# Patient Record
Sex: Female | Born: 1975 | Race: White | Hispanic: No | Marital: Married | Smoking: Former smoker
Health system: Southern US, Community
[De-identification: ages and names within clinical notes are randomized; demographics above are authoritative.]

## PROBLEM LIST (undated history)

## (undated) DIAGNOSIS — N926 Irregular menstruation, unspecified: Secondary | ICD-10-CM

## (undated) DIAGNOSIS — E669 Obesity, unspecified: Secondary | ICD-10-CM

## (undated) DIAGNOSIS — F419 Anxiety disorder, unspecified: Secondary | ICD-10-CM

## (undated) HISTORY — DX: Obesity, unspecified: E66.9

## (undated) HISTORY — PX: APPENDECTOMY: SHX54

## (undated) HISTORY — DX: Irregular menstruation, unspecified: N92.6

## (undated) HISTORY — DX: Anxiety disorder, unspecified: F41.9

---
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
## 1998-07-09 ENCOUNTER — Encounter: Payer: Self-pay | Admitting: Family Medicine

## 2000-02-04 HISTORY — PX: CRYOTHERAPY: SHX1416

## 2005-02-03 HISTORY — PX: CHOLECYSTECTOMY: SHX55

## 2007-07-17 ENCOUNTER — Emergency Department (HOSPITAL_COMMUNITY): Admission: EM | Admit: 2007-07-17 | Discharge: 2007-07-17 | Payer: Self-pay | Admitting: Emergency Medicine

## 2010-01-24 ENCOUNTER — Ambulatory Visit: Payer: Self-pay | Admitting: Family Medicine

## 2010-01-24 DIAGNOSIS — B343 Parvovirus infection, unspecified: Secondary | ICD-10-CM | POA: Insufficient documentation

## 2010-01-24 DIAGNOSIS — R21 Rash and other nonspecific skin eruption: Secondary | ICD-10-CM

## 2010-03-06 ENCOUNTER — Encounter: Payer: Self-pay | Admitting: Family Medicine

## 2010-03-06 DIAGNOSIS — F988 Other specified behavioral and emotional disorders with onset usually occurring in childhood and adolescence: Secondary | ICD-10-CM | POA: Insufficient documentation

## 2010-03-06 DIAGNOSIS — R002 Palpitations: Secondary | ICD-10-CM | POA: Insufficient documentation

## 2010-03-06 DIAGNOSIS — F410 Panic disorder [episodic paroxysmal anxiety] without agoraphobia: Secondary | ICD-10-CM | POA: Insufficient documentation

## 2010-03-07 NOTE — Assessment & Plan Note (Signed)
Summary: NOV PARVOVIRUS   Vital Signs:  Patient profile:   35 year old female Height:      66 inches Weight:      176 pounds Temp:     98.4 degrees F Pulse rate:   86 / minute BP sitting:   134 / 86  (left arm) Cuff size:   regular  Vitals Entered By: Kathlene November LPN (January 24, 2010 12:53 PM) CC: feet swelling, feels knots on feet, sever pain- can not hardly walk for 3-4 days. Has been taking Naproxen and not helping Pain Assessment Patient in pain? yes     Location: foot Intensity: 10 Type: sharp Onset of pain  3-4 days ago   Primary Care Provider:  Nani Gasser MD  CC:  feet swelling, feels knots on feet, and sever pain- can not hardly walk for 3-4 days. Has been taking Naproxen and not helping.  History of Present Illness: 35 yo WF presents for bilat foot pain x 4 days.  She had a rash last week and her son had a viral infection with his rash.  She had subjective fevers and joint aches.  Denies any GI upset or sore throat or congestion.  Her joint pains are improving but her hands feel swollen.  Her knees ache some.  Her lower back is achey.    She is taking Aleve but it is not helping.  Denies any trauma.    Current Medications (verified): 1)  None  Allergies (verified): No Known Drug Allergies  Comments:  Nurse/Medical Assistant: The patient's medications and allergies were reviewed with the patient and were updated in the Medication and Allergy Lists. Kathlene November LPN (January 24, 2010 12:54 PM)  Past History:  Past Medical History: none  Past Surgical History: knee surgery 1997 lap chole 07  Family History: mother HTN father DM  Social History: SAHM. finished college. Married to [place].  Has 2 sons. Quit smoking in 06. Denies exercsie.  Review of Systems      See HPI       no fevers/sweats/weakness, unexplained wt loss/gain, no change in vision, no difficulty hearing, ringing in ears, no hay fever/allergies, no CP/discomfort, no  palpitations, no breast lump/nipple discharge, no cough/wheeze, no blood in stool, no N/V/D, no nocturia, no leaking urine, no unusual vag bleeding, no vaginal/penile discharge, + muscle/joint pain, + rash, no new/changing mole, no HA, no memory loss, no anxiety, no sleep problem, no depression, no unexplained lumps, no easy bruising/bleeding, no concern with sexual function   Physical Exam  General:  alert, well-developed, well-nourished, and well-hydrated.   Head:  normocephalic and atraumatic.   Eyes:  conjunctiva clear Ears:  EACs patent; TMs translucent and gray with good cone of light and bony landmarks.  Nose:  no nasal congestion Mouth:  o/p injected with R>L tonsilar hypertrophy, small R tonsilar exduate Neck:  shotty anterior cervica chain LA Lungs:  Normal respiratory effort, chest expands symmetrically. Lungs are clear to auscultation, no crackles or wheezes. Heart:  normal rate, regular rhythm, and no murmur.   Abdomen:  soft, non-tender, normal bowel sounds, no distention, no hepatomegaly, and no splenomegaly.   Msk:  tender joints w/o effusions Extremities:  no LE edema Skin:  facial flushing with faint lacey rash on her trunk and extremities.   Psych:  good eye contact, not anxious appearing, and not depressed appearing.     Impression & Recommendations:  Problem # 1:  PARVOVIRUS B19 (ICD-079.83) Rapid strep neg.  History and  physical are c/w Parvovirus B19. H/O given to pt on this virus and can certainly explain her low grade fever, lacey rash, erythematous cheeks and constitutional symptoms followed by arthropathy after her sons similar illness.  Will treat her with Motrin 800 mg three times a day with food + supportive care. Hopefully her arthropathy resolves in the next days/ wks but it can last longer. She is to call if any bruising or bleeding to check her PLT count.  Avoid exposure to pregnant women and call if any problems.  Complete Medication List: 1)   Ibuprofen 800 Mg Tabs (Ibuprofen) .Marland Kitchen.. 1 tab by mouth three times a day with food as needed for joint pains  Other Orders: Rapid Strep (19147)  Patient Instructions: 1)  Treat for Parvovirus with rest, clear fluids, Tylenol alternating with RX Ibuprofen 3 x a day with food. 2)  Joint aches and pains should improve in the next week. 3)  If you develop any bleeding or bruising, please call us. Prescriptions: IBUPROFEN 800 MG TABS (IBUPROFEN) 1 tab by mouth three times a day with food as needed for joint pains  #40 x 0   Entered and Authorized by:   Seymour Bars DO   Signed by:   Seymour Bars DO on 01/24/2010   Method used:   Electronically to        [facility]  [address] (retail)       [address]       [place], [place]  [address]       Ph: [phone]       Fax: [phone]   RxID:   [id number]    Orders Added: 1)  Rapid Strep [03474] 2)  New Patient Level III [99203]    Laboratory Results    Other Tests  Rapid Strep: negative

## 2010-03-13 NOTE — Miscellaneous (Signed)
Summary: old records  Clinical Lists Changes  Problems: Added new problem of PALPITATIONS (ICD-785.1) Added new problem of ADD (ICD-314.00) Added new problem of PANIC ATTACK (ICD-300.01) Assessed PANIC ATTACK as comment only - Was on Celexa 2009. Observations: Added new observation of PRIMARY MD: Seymour Bars DO (03/06/2010 10:20)        Impression & Recommendations:  Problem # 1:  PANIC ATTACK (ICD-300.01) Was on Celexa 2009.  Complete Medication List: 1)  Ibuprofen 800 Mg Tabs (Ibuprofen) .Marland Kitchen.. 1 tab by mouth three times a day with food as needed for joint pains

## 2010-03-21 NOTE — Letter (Signed)
Summary: Records from Dr. Katrinka Blazing 1996 - 2000  Records from Dr. Katrinka Blazing 1996 - 2000   Imported By: Maryln Gottron 03/13/2010 13:21:09  _____________________________________________________________________  External Attachment:    Type:   Image     Comment:   External Document

## 2010-04-16 ENCOUNTER — Encounter: Payer: Self-pay | Admitting: Family Medicine

## 2010-04-16 ENCOUNTER — Ambulatory Visit (INDEPENDENT_AMBULATORY_CARE_PROVIDER_SITE_OTHER): Payer: Self-pay

## 2010-04-16 DIAGNOSIS — Z111 Encounter for screening for respiratory tuberculosis: Secondary | ICD-10-CM

## 2010-04-16 DIAGNOSIS — Z23 Encounter for immunization: Secondary | ICD-10-CM

## 2010-04-23 NOTE — Assessment & Plan Note (Signed)
Summary: TB and Tetnus booster   Nurse Visit   Allergies: No Known Drug Allergies  Immunizations Administered:  Tetanus Vaccine:    Vaccine Type: Tdap    Site: right deltoid    Mfr: GlaxoSmithKline    Dose: 0.5 ml    Route: IM    Given by: Sue Lush McCrimmon CMA, (AAMA)    Exp. Date: 11/23/2010    Lot #: [id number]    VIS given: 12/22/07 version given April 16, 2010.  PPD Skin Test:    Vaccine Type: PPD    Site: right forearm    Mfr: Sanofi Pasteur    Dose: 0.1 ml    Given by: Sue Lush McCrimmon CMA, (AAMA)    Exp. Date: 10/05/2011    Lot #: [id number]  Orders Added: 1)  Tdap => 53yrs IM [90715] 2)  Admin 1st Vaccine [90471] 3)  TB Skin Test [86580] 4)  Admin of Any Addtl Vaccine [62130]

## 2010-04-30 ENCOUNTER — Ambulatory Visit: Payer: Self-pay

## 2010-10-31 LAB — URINALYSIS, ROUTINE W REFLEX MICROSCOPIC
Glucose, UA: NEGATIVE
Ketones, ur: NEGATIVE
Protein, ur: NEGATIVE

## 2010-10-31 LAB — DIFFERENTIAL
Basophils Relative: 1
Eosinophils Absolute: 0.2
Monocytes Absolute: 0.4
Monocytes Relative: 7
Neutrophils Relative %: 75

## 2010-10-31 LAB — BASIC METABOLIC PANEL
CO2: 25
Chloride: 113 — ABNORMAL HIGH
Creatinine, Ser: 0.64
GFR calc Af Amer: >60
Glucose, Bld: 108 — ABNORMAL HIGH

## 2010-10-31 LAB — CBC
MCHC: 35
MCV: 91.5
RBC: 4.03

## 2010-10-31 LAB — URINE MICROSCOPIC-ADD ON

## 2011-02-13 ENCOUNTER — Telehealth: Payer: Self-pay | Admitting: Family Medicine

## 2011-02-13 ENCOUNTER — Ambulatory Visit (INDEPENDENT_AMBULATORY_CARE_PROVIDER_SITE_OTHER): Payer: 59 | Admitting: Family Medicine

## 2011-02-13 ENCOUNTER — Encounter: Payer: Self-pay | Admitting: *Deleted

## 2011-02-13 ENCOUNTER — Ambulatory Visit
Admission: RE | Admit: 2011-02-13 | Discharge: 2011-02-13 | Disposition: A | Discharge status: Home / Self Care | Payer: 59 | Source: Ambulatory Visit | Attending: Family Medicine | Admitting: Family Medicine

## 2011-02-13 ENCOUNTER — Encounter (HOSPITAL_COMMUNITY): Payer: Self-pay | Admitting: Emergency Medicine

## 2011-02-13 ENCOUNTER — Emergency Department (HOSPITAL_COMMUNITY)
Admission: EM | Admit: 2011-02-13 | Discharge: 2011-02-13 | Disposition: A | Discharge status: Home / Self Care | Payer: 59 | Attending: Emergency Medicine | Admitting: Emergency Medicine

## 2011-02-13 VITALS — BP 148/83 | HR 97 | Temp 97.5°F | Ht 66.0 in | Wt 178.0 lb

## 2011-02-13 DIAGNOSIS — R1031 Right lower quadrant pain: Secondary | ICD-10-CM

## 2011-02-13 DIAGNOSIS — R109 Unspecified abdominal pain: Secondary | ICD-10-CM | POA: Insufficient documentation

## 2011-02-13 DIAGNOSIS — Z9089 Acquired absence of other organs: Secondary | ICD-10-CM | POA: Insufficient documentation

## 2011-02-13 LAB — CBC
HCT: 39.3 % (ref 36.0–46.0)
MCH: 31.1 pg (ref 26.0–34.0)
MCV: 91.2 fL (ref 78.0–100.0)
Platelets: 230 10*3/uL (ref 150–400)
RDW: 11.7 % (ref 11.5–15.5)
WBC: 7.5 10*3/uL (ref 4.0–10.5)

## 2011-02-13 LAB — URINALYSIS, ROUTINE W REFLEX MICROSCOPIC
Bilirubin Urine: NEGATIVE
Glucose, UA: NEGATIVE mg/dL
Ketones, ur: NEGATIVE mg/dL
Protein, ur: NEGATIVE mg/dL
pH: 7.5 (ref 5.0–8.0)

## 2011-02-13 LAB — DIFFERENTIAL
Basophils Absolute: 0 10*3/uL (ref 0.0–0.1)
Eosinophils Absolute: 0.3 10*3/uL (ref 0.0–0.7)
Eosinophils Relative: 4 % (ref 0–5)
Lymphocytes Relative: 30 % (ref 12–46)
Lymphs Abs: 2.3 10*3/uL (ref 0.7–4.0)
Monocytes Absolute: 0.6 10*3/uL (ref 0.1–1.0)

## 2011-02-13 MED ORDER — IOHEXOL 300 MG/ML  SOLN
100.0000 mL | Freq: Once | INTRAMUSCULAR | Status: AC | PRN
  Administered 2011-02-13: 100 mL via INTRAVENOUS

## 2011-02-13 NOTE — ED Provider Notes (Addendum)
History     CSN: [id number]  Arrival date & time 02/13/11  1642   First MD Initiated Contact with Patient 02/13/11 1947      Chief Complaint  Patient presents with  . Abdominal Pain    (Consider location/radiation/quality/duration/timing/severity/associated sxs/prior treatment) HPI Comments: Sent here from pcp's office for eval of rlq pain she has been having for the past two weeks.  The ct done in the office shows a dilated appendix, unsure as to whether normal variant or early acute appendicitis.    Patient is a 36 y.o. female presenting with abdominal pain.  Abdominal Pain The primary symptoms of the illness include abdominal pain.    No past medical history on file.  Past Surgical History  Procedure Date  . Knee surgery 1997  . Cholecystectomy 2007    Family History  Problem Relation Age of Onset  . Hypertension Mother   . Diabetes Father   . Hypertension Father     History  Substance Use Topics  . Smoking status: Former Smoker    Quit date: 02/04/2004  . Smokeless tobacco: Not on file  . Alcohol Use: Yes    OB History    Grav Para Term Preterm Abortions TAB SAB Ect Mult Living                  Review of Systems  Gastrointestinal: Positive for abdominal pain.    Allergies  Review of patient's allergies indicates no known allergies.  Home Medications  No current outpatient prescriptions on file.  BP 149/75  Pulse 93  Temp(Src) 98.8 F (37.1 C) (Oral)  Resp 12  Ht 5\' 6"  (1.676 m)  Wt 170 lb (77.111 kg)  BMI 27.44 kg/m2  SpO2 100%  LMP 01/31/2011  Physical Exam  Nursing note and vitals reviewed. Constitutional: She is oriented to person, place, and time. She appears well-developed and well-nourished. No distress.  HENT:  Head: Normocephalic and atraumatic.  Neck: Normal range of motion. Neck supple.  Cardiovascular: Normal rate and regular rhythm.   Pulmonary/Chest: Effort normal and breath sounds normal.  Abdominal: Soft.       There  is ttp in the rlq but no rebound or guarding.  Bowel sounds are normoactive.  Musculoskeletal: Normal range of motion.  Neurological: She is alert and oriented to person, place, and time.  Skin: Skin is warm and dry. She is not diaphoretic.    ED Course  Procedures (including critical care time)  Labs Reviewed - No data to display Ct Abdomen Pelvis W Contrast  02/13/2011  *RADIOLOGY REPORT*  Clinical Data: Right lower quadrant pain.  Evaluate for appendicitis.  Pain since December 20.  Cholecystectomy in 2012.  CT ABDOMEN AND PELVIS WITH CONTRAST  Technique:  Multidetector CT imaging of the abdomen and pelvis was performed following the standard protocol during bolus administration of intravenous contrast.  Contrast: OMNIPAQUE IOHEXOL 300 MG/ML IV SOLN  Comparison: None.  Findings: Clear lung bases.  Normal heart size without pericardial or pleural effusion.  Normal liver, spleen, stomach, pancreas. Cholecystectomy without biliary ductal dilatation.  Normal adrenal glands and kidneys.  Prominent gonadal veins bilaterally. No retroperitoneal or retrocrural adenopathy.  The sigmoid colon is underdistended.  Portions of the descending and hepatic flexure colon appear mildly thick-walled, including on images 24 and 30.  These could also be secondary to underdistension.  The terminal ileum is normal on image 52.  The appendix is identified on transverse image 61 coronal image 28. Measures up  to the 10 - 11 mm, including on image 63 transverse, image 28 coronal.  There is no mucosal hyperenhancement.  No definite surrounding edema.  No periappendiceal abscess.  Normal small bowel without abdominal ascites.    No pelvic adenopathy.  Normal urinary bladder and uterus, without adnexal mass or significant free pelvic fluid.  No acute osseous abnormality.  IMPRESSION:  1.  Abnormal appearance of the appendix.  Measures up to 10 - 11 mm (upper limits of normal 8 - 9 mm).  However, there is no evidence of  surrounding inflammation or mucosal hyperenhancement.  The clinical data describes right lower quadrant pain since December 20.  If the patient's symptoms are acutely progressive, early appendicitis is suspected.  If the patient's symptoms are chronic and indolent, appendicitis is less likely and this could be within normal variation. 2.  Equivocal left-sided colonic wall thickening, possibly due to underdistension.  Correlate with symptoms to suggest infectious colitis. 3.  Prominent gonadal veins, as can be seen with pelvic congestion syndrome.  These results will be called to the ordering clinician or representative by the Radiologist Assistant, and communication documented in the PACS Dashboard.  Original Report Authenticated By: Consuello Bossier, M.D.     No diagnosis found.    MDM  The patient presents with a two week history of rlq pain, normal wbc count, and ct scan that suggests but not diagnostic of appy.  Will consult general surgery for evaluation.   Seen by general surgery, they do not believe this to be an emergently surgical situation.  Will discharge to home and follow up with them.        Geoffery Lyons, MD 02/13/11 9811  Geoffery Lyons, MD 02/13/11 9147  Geoffery Lyons, MD 03/26/11 1209

## 2011-02-13 NOTE — Consult Note (Signed)
Chief Complaint  Patient presents with  . Abdominal Pain   Asked to see patient by Dr. Eppie Gibson (primary care) and Dr. Judd Lien (ER at [facility])  HISTORY: Patient is a 36 yo white female with a history of abdominal pain.  Pain has been present intermittently for approx 3 weeks.  Localizes to RLQ.  May last all day.  May be gone for 3-4 days.  No nausea. No fever. No chills.  Normal appetite and normal BM's.  No urinary sx's.  No vaginal discharge.  No hx of endometriosis.  Patient seen by primary MD and referred for CT Abd.  Only finding was a slight thickened appendix at 10-11 mm.  No inflammatory findings.  Referred to [facility] ER for surgical evaluation.  Lab work with normal WBC of 7.5.  No past medical history on file.   No current facility-administered medications for this encounter.   Current Outpatient Prescriptions  Medication Sig Dispense Refill  . ibuprofen (ADVIL,MOTRIN) 600 MG tablet Take 600 mg by mouth every 6 (six) hours as needed. PAIN       Facility-Administered Medications Ordered in Other Encounters  Medication Dose Route Frequency Provider Last Rate Last Dose  . iohexol (OMNIPAQUE) 300 MG/ML solution 100 mL  100 mL Intravenous Once PRN Medication Radiologist   100 mL at 02/13/11 1318     No Known Allergies   Family History  Problem Relation Age of Onset  . Hypertension Mother   . Diabetes Father   . Hypertension Father      History   Social History  . Marital Status: Married    Spouse Name: N/A    Number of Children: N/A  . Years of Education: N/A   Social History Main Topics  . Smoking status: Former Smoker    Quit date: 02/04/2004  . Smokeless tobacco: None  . Alcohol Use: Yes  . Drug Use: No  . Sexually Active: Yes    Birth Control/ Protection: None   Other Topics Concern  . None   Social History Narrative  . None     REVIEW OF SYSTEMS - PERTINENT POSITIVES ONLY: As above.  No fever, no chills, no nausea, no emesis.  At present, no pain.   Hungry.   EXAM: Filed Vitals:   02/13/11 1723  BP: 149/75  Pulse: 93  Temp: 98.8 F (37.1 C)  Resp: 12    HEENT: normocephalic; pupils equal and reactive; sclerae clear; dentition good; mucous membranes moist NECK:  No nodules; symmetric on extension; no palpable anterior or posterior cervical lymphadenopathy; no supraclavicular masses; no tenderness CHEST: clear to auscultation bilaterally without rales, rhonchi, or wheezes CARDIAC: regular rate and rhythm without significant murmur; peripheral pulses are full ABDOMEN: Soft without distension; normal BS; no mass; no hernia; healed wound at umbilicus; minimal tender mid RLQ; no guarding; no rebound EXT:  non-tender without edema; no deformity NEURO: no gross focal deficits; no sign of tremor   LABORATORY RESULTS: See E-Chart for most recent results   RADIOLOGY RESULTS: See E-Chart or I-Site for most recent results   IMPRESSION: Abdominal pain, intermittent, of undetermined etiology.  No evidence of acute abdomen.  PLAN: Patient does not desire admission or laparoscopy at this time.  Will discharge from ER with mother.  Patient to follow up by telephone tomorrow with my office.  She will see me in the office in 3 weeks.  If persistent symptoms, will consider laparoscopy with appendectomy.  If acute symptoms develop in the interim, she will contact  me.  Follow up with surgeon in 3 weeks.  Velora Heckler, MD, FACS General & Endocrine Surgery [facility]   Visit Diagnoses: No diagnosis found.  Primary Care Physician: Seymour Bars, DO, DO

## 2011-02-13 NOTE — Progress Notes (Signed)
  Subjective:    Patient ID: Tammy Rivera, female    DOB: 05-25-1975, 36 y.o.   MRN: [id number]  HPI Start with generalized cramping and then progressed to RLQ pain.  Says it is intermittant. Has been exercisinging.  No triggers that make it worse.  No real alleviating sxs.  Says now is a dull ache and radiates into her right low back too.  No fever now.  Iniitally when started before x mas with the cramping vomited 2 x but not diarrhea.  Worse the last 2 days and more constant. Sometimes will wake up with it.  Normla period but did have some mild cramping after her period and that was unusual. Initially felt better to be in a "ball" but now can stand up and it is not worse.    Review of Systems BP 148/83  Pulse 97  Temp(Src) 97.5 F (36.4 C) (Oral)  Ht 5\' 6"  (1.676 m)  Wt 178 lb (80.74 kg)  BMI 28.73 kg/m2  SpO2 100%    Allergies not on file  No past medical history on file.  No past surgical history on file.  History   Social History  . Marital Status: Married    Spouse Name: N/A    Number of Children: N/A  . Years of Education: N/A   Occupational History  . Not on file.   Social History Main Topics  . Smoking status: Not on file  . Smokeless tobacco: Not on file  . Alcohol Use: Not on file  . Drug Use: Not on file  . Sexually Active: Not on file   Other Topics Concern  . Not on file   Social History Narrative  . No narrative on file    No family history on file.       Objective:   Physical Exam  Constitutional: She is oriented to person, place, and time. She appears well-developed and well-nourished.  Cardiovascular: Normal rate, regular rhythm and normal heart sounds.   Pulmonary/Chest: Effort normal and breath sounds normal.  Abdominal: Soft. Bowel sounds are normal. She exhibits no distension and no mass. There is tenderness. There is no rebound and no guarding.       Tender over the right mid abdomen and RLQ.  Some guarding over the right  mid-abdomen.   Neurological: She is alert and oriented to person, place, and time.  Skin: Skin is warm and dry.  Psychiatric: She has a normal mood and affect. Her behavior is normal.          Assessment & Plan:  RLQ pain - Consider either apppendicitis vs ovarian cyst. I really don't think this is colitis. Will schedule for CT today. Insurance contacted for prior auth. She is not in need of pain medications at this time.  If normal then consider check CBC and CMP.  Will call as soon as get results. Go to ED if pain suddenly gets worse or feels weak or dizzy.

## 2011-02-13 NOTE — Telephone Encounter (Signed)
Spoke with the surgeon on call at [facility] ( Dr. Michaell Cowing). He looked at the Ct and we discussed case.  Pt to go to the ED and have on call surg paged ot examine her and see if may be her appendix or not.

## 2011-02-13 NOTE — ED Notes (Signed)
Pt with abdominal pain and illness on and off since week before Christmas. Went to [facility] and had CT of Abdomen done and was advised to go to "ED to see Dr. Gerrit Friends." Pt denies n/v and reports only "a little" pain in abdomen.

## 2011-02-14 ENCOUNTER — Telehealth (INDEPENDENT_AMBULATORY_CARE_PROVIDER_SITE_OTHER): Payer: Self-pay

## 2011-02-14 NOTE — Telephone Encounter (Signed)
Patient called given hospital follow up appointment - She stated she is doing ok at the moment- Patient advised to call if the condition should worsen.

## 2011-03-10 ENCOUNTER — Encounter (INDEPENDENT_AMBULATORY_CARE_PROVIDER_SITE_OTHER): Payer: Self-pay | Admitting: Surgery

## 2011-03-14 ENCOUNTER — Ambulatory Visit: Payer: 59 | Admitting: Physician Assistant

## 2011-03-14 ENCOUNTER — Ambulatory Visit (INDEPENDENT_AMBULATORY_CARE_PROVIDER_SITE_OTHER): Payer: 59 | Admitting: Physician Assistant

## 2011-03-14 ENCOUNTER — Encounter: Payer: Self-pay | Admitting: Physician Assistant

## 2011-03-14 VITALS — BP 137/85 | HR 84 | Temp 98.4°F | Ht 66.0 in | Wt 177.0 lb

## 2011-03-14 DIAGNOSIS — J029 Acute pharyngitis, unspecified: Secondary | ICD-10-CM

## 2011-03-14 MED ORDER — AMOXICILLIN-POT CLAVULANATE 875-125 MG PO TABS: 1.0000 | ORAL_TABLET | Freq: Two times a day (BID) | ORAL | Status: DC

## 2011-03-14 NOTE — Progress Notes (Signed)
  Subjective:    Patient ID: Tammy Rivera, female    DOB: [birth year], 36 y.o.   MRN: [id number]  HPI Patient brought son in on Monday with strep throat. She started having a sore throat on Monday. It has gradually worsened for 5 days. She denies any SOB, cough, runny nose, fever, chills,ear pain or headaches. She has not been able to eat anything. She has tried lozengers with help minimally and she is drinking a lot. She takes advil for the pain but it does not help. Patient reports her throat hurts even when she talkes and rates it a 6/10.   Patient has not had a Pap smear in a number of years. She has had abnormal pap smears in the past.  Review of Systems     Objective:   Physical Exam  Constitutional: She is oriented to person, place, and time. She appears well-developed and well-nourished.  HENT:  Head: Normocephalic and atraumatic.  Right Ear: External ear normal.  Left Ear: External ear normal.  Nose: Nose normal.  Mouth/Throat: No oropharyngeal exudate.       Oropharynx erythematous without exudate.   Eyes: Conjunctivae are normal.  Neck:       Bilateral anterior cervical adenopathy with tenderness to palpation.   Cardiovascular: Normal rate, regular rhythm and normal heart sounds.   Pulmonary/Chest: Effort normal and breath sounds normal.  Lymphadenopathy:    She has cervical adenopathy.  Neurological: She is alert and oriented to person, place, and time.  Psychiatric: She has a normal mood and affect. Her behavior is normal.          Assessment & Plan:  Sore throat- Rapid Strep (neg) Due to patient being exposed to strep and no other upper respiratory symptoms. I treated empirically with Augmentin. Handout given on home treatment of sore throat. Call if not improving by Monday.   Discussed need to schedule PaP smear.

## 2011-03-14 NOTE — Patient Instructions (Signed)
Start antibiotic. If not improving call office on Monday.   Strep Throat Strep throat is an infection of the throat caused by a bacteria named Streptococcus pyogenes. Your caregiver may call the infection streptococcal "tonsillitis" or "pharyngitis" depending on whether there are signs of inflammation in the tonsils or back of the throat. Strep throat is most common in children from 21 to 34 years old during the cold months of the year, but it can occur in people of any age during any season. This infection is spread from person to person (contagious) through coughing, sneezing, or other close contact. SYMPTOMS   Fever or chills.   Painful, swollen, red tonsils or throat.   Pain or difficulty when swallowing.   White or yellow spots on the tonsils or throat.   Swollen, tender lymph nodes or "glands" of the neck or under the jaw.   Red rash all over the body (rare).  DIAGNOSIS  Many different infections can cause the same symptoms. A test must be done to confirm the diagnosis so the right treatment can be given. A "rapid strep test" can help your caregiver make the diagnosis in a few minutes. If this test is not available, a light swab of the infected area can be used for a throat culture test. If a throat culture test is done, results are usually available in a day or two. TREATMENT  Strep throat is treated with antibiotic medicine. HOME CARE INSTRUCTIONS   Gargle with 1 tsp of salt in 1 cup of warm water, 3 to 4 times per day or as needed for comfort.   Family members who also have a sore throat or fever should be tested for strep throat and treated with antibiotics if they have the strep infection.   Make sure everyone in your household washes their hands well.   Do not share food, drinking cups, or personal items that could cause the infection to spread to others.   You may need to eat a soft food diet until your sore throat gets better.   Drink enough water and fluids to keep your  urine clear or pale yellow. This will help prevent dehydration.   Get plenty of rest.   Stay home from school, daycare, or work until you have been on antibiotics for 24 hours.   Only take over-the-counter or prescription medicines for pain, discomfort, or fever as directed by your caregiver.   If antibiotics are prescribed, take them as directed. Finish them even if you start to feel better.  SEEK MEDICAL CARE IF:   The glands in your neck continue to enlarge.   You develop a rash, cough, or earache.   You cough up green, yellow-brown, or bloody sputum.   You have pain or discomfort not controlled by medicines.   Your problems seem to be getting worse rather than better.  SEEK IMMEDIATE MEDICAL CARE IF:   You develop any new symptoms such as vomiting, severe headache, stiff or painful neck, chest pain, shortness of breath, or trouble swallowing.   You develop severe throat pain, drooling, or changes in your voice.   You develop swelling of the neck, or the skin on the neck becomes red and tender.   You have a fever.   You develop signs of dehydration, such as fatigue, dry mouth, and decreased urination.   You become increasingly sleepy, or you cannot wake up completely.  Document Released: 01/18/2000 Document Revised: 10/02/2010 Document Reviewed: 03/21/2010 [facility] Patient Information 2012 [place], [place].

## 2011-07-18 ENCOUNTER — Ambulatory Visit (HOSPITAL_COMMUNITY)
Admission: EM | Admit: 2011-07-18 | Discharge: 2011-07-18 | Disposition: A | Discharge status: Home / Self Care | Payer: 59 | Attending: General Surgery | Admitting: General Surgery

## 2011-07-18 ENCOUNTER — Encounter (HOSPITAL_COMMUNITY)
Admission: EM | Disposition: A | Discharge status: Home / Self Care | Payer: Self-pay | Source: Home / Self Care | Attending: Emergency Medicine

## 2011-07-18 ENCOUNTER — Encounter (HOSPITAL_COMMUNITY): Payer: Self-pay | Admitting: Anesthesiology

## 2011-07-18 ENCOUNTER — Observation Stay (HOSPITAL_COMMUNITY): Payer: 59 | Admitting: Anesthesiology

## 2011-07-18 ENCOUNTER — Emergency Department (HOSPITAL_COMMUNITY): Payer: 59

## 2011-07-18 ENCOUNTER — Encounter (HOSPITAL_COMMUNITY): Payer: Self-pay | Admitting: Emergency Medicine

## 2011-07-18 DIAGNOSIS — K3580 Unspecified acute appendicitis: Secondary | ICD-10-CM

## 2011-07-18 HISTORY — PX: LAPAROSCOPIC APPENDECTOMY: SHX408

## 2011-07-18 LAB — LIPASE, BLOOD: Lipase: 51 U/L (ref 11–59)

## 2011-07-18 LAB — DIFFERENTIAL
Basophils Relative: 0 % (ref 0–1)
Lymphocytes Relative: 5 % — ABNORMAL LOW (ref 12–46)
Lymphs Abs: 0.6 10*3/uL — ABNORMAL LOW (ref 0.7–4.0)
Monocytes Relative: 4 % (ref 3–12)
Neutro Abs: 10.2 10*3/uL — ABNORMAL HIGH (ref 1.7–7.7)
Neutrophils Relative %: 90 % — ABNORMAL HIGH (ref 43–77)

## 2011-07-18 LAB — URINALYSIS, ROUTINE W REFLEX MICROSCOPIC
Glucose, UA: NEGATIVE mg/dL
Leukocytes, UA: NEGATIVE
Nitrite: NEGATIVE
Specific Gravity, Urine: 1.015 (ref 1.005–1.030)
pH: 7 (ref 5.0–8.0)

## 2011-07-18 LAB — COMPREHENSIVE METABOLIC PANEL
Albumin: 4.7 g/dL (ref 3.5–5.2)
Alkaline Phosphatase: 61 U/L (ref 39–117)
BUN: 8 mg/dL (ref 6–23)
CO2: 24 mEq/L (ref 19–32)
Chloride: 99 mEq/L (ref 96–112)
GFR calc non Af Amer: >90 mL/min (ref >90)
Glucose, Bld: 108 mg/dL — ABNORMAL HIGH (ref 70–99)
Potassium: 3.4 mEq/L — ABNORMAL LOW (ref 3.5–5.1)
Total Bilirubin: 0.9 mg/dL (ref 0.3–1.2)

## 2011-07-18 LAB — CBC
HCT: 41.5 % (ref 36.0–46.0)
Hemoglobin: 14.4 g/dL (ref 12.0–15.0)
RBC: 4.55 MIL/uL (ref 3.87–5.11)
WBC: 11.3 10*3/uL — ABNORMAL HIGH (ref 4.0–10.5)

## 2011-07-18 LAB — PREGNANCY, URINE: Preg Test, Ur: NEGATIVE

## 2011-07-18 SURGERY — APPENDECTOMY, LAPAROSCOPIC
Anesthesia: General | Wound class: Contaminated

## 2011-07-18 MED ORDER — LACTATED RINGERS IV SOLN
INTRAVENOUS | Status: DC
  Administered 2011-07-18: 13:00:00 via INTRAVENOUS

## 2011-07-18 MED ORDER — HYDROCODONE-ACETAMINOPHEN 5-325 MG PO TABS: 1.0000 | ORAL_TABLET | ORAL | Status: AC | PRN

## 2011-07-18 MED ORDER — BUPIVACAINE HCL 0.5 % IJ SOLN
INTRAMUSCULAR | Status: DC | PRN
  Administered 2011-07-18: 10 mL

## 2011-07-18 MED ORDER — FENTANYL CITRATE 0.05 MG/ML IJ SOLN
50.0000 ug | Freq: Once | INTRAMUSCULAR | Status: AC
  Administered 2011-07-18: 50 ug via INTRAVENOUS
  Filled 2011-07-18: qty 2

## 2011-07-18 MED ORDER — ENOXAPARIN SODIUM 40 MG/0.4ML ~~LOC~~ SOLN
SUBCUTANEOUS | Status: AC
  Administered 2011-07-18: 40 mg via SUBCUTANEOUS
  Filled 2011-07-18: qty 0.4

## 2011-07-18 MED ORDER — SODIUM CHLORIDE 0.9 % IV SOLN
1.0000 g | INTRAVENOUS | Status: AC
  Administered 2011-07-18: 1 g via INTRAVENOUS

## 2011-07-18 MED ORDER — GLYCOPYRROLATE 0.2 MG/ML IJ SOLN
INTRAMUSCULAR | Status: DC | PRN
  Administered 2011-07-18: 0.2 mg via INTRAVENOUS

## 2011-07-18 MED ORDER — CELECOXIB 100 MG PO CAPS
ORAL_CAPSULE | ORAL | Status: AC
  Administered 2011-07-18: 400 mg via ORAL
  Filled 2011-07-18: qty 4

## 2011-07-18 MED ORDER — ONDANSETRON HCL 4 MG/2ML IJ SOLN
4.0000 mg | Freq: Once | INTRAMUSCULAR | Status: AC
  Administered 2011-07-18: 4 mg via INTRAVENOUS
  Filled 2011-07-18: qty 2

## 2011-07-18 MED ORDER — MIDAZOLAM HCL 2 MG/2ML IJ SOLN
INTRAMUSCULAR | Status: AC
  Filled 2011-07-18: qty 2

## 2011-07-18 MED ORDER — SODIUM CHLORIDE 0.9 % IJ SOLN
3.0000 mL | INTRAMUSCULAR | Status: AC
  Administered 2011-07-18: 3 mL via INTRAVENOUS
  Filled 2011-07-18: qty 3

## 2011-07-18 MED ORDER — LIDOCAINE HCL (CARDIAC) 10 MG/ML IV SOLN
INTRAVENOUS | Status: DC | PRN
  Administered 2011-07-18: 10 mg via INTRAVENOUS

## 2011-07-18 MED ORDER — ACETAMINOPHEN 325 MG PO TABS: 325.0000 mg | ORAL_TABLET | ORAL | Status: DC | PRN

## 2011-07-18 MED ORDER — CELECOXIB 100 MG PO CAPS
400.0000 mg | ORAL_CAPSULE | Freq: Every day | ORAL | Status: AC
  Administered 2011-07-18: 400 mg via ORAL

## 2011-07-18 MED ORDER — SODIUM CHLORIDE 0.9 % IV SOLN
INTRAVENOUS | Status: AC
  Filled 2011-07-18: qty 1

## 2011-07-18 MED ORDER — PROPOFOL 10 MG/ML IV EMUL
INTRAVENOUS | Status: AC
  Filled 2011-07-18: qty 20

## 2011-07-18 MED ORDER — ONDANSETRON HCL 4 MG/2ML IJ SOLN: 4.0000 mg | Freq: Once | INTRAMUSCULAR | Status: DC | PRN

## 2011-07-18 MED ORDER — ONDANSETRON HCL 4 MG/2ML IJ SOLN
4.0000 mg | Freq: Once | INTRAMUSCULAR | Status: AC
  Administered 2011-07-18: 4 mg via INTRAVENOUS

## 2011-07-18 MED ORDER — SODIUM CHLORIDE 0.9 % IV SOLN
Freq: Once | INTRAVENOUS | Status: AC
  Administered 2011-07-18: 1000 mL via INTRAVENOUS

## 2011-07-18 MED ORDER — SODIUM CHLORIDE 0.9 % IR SOLN
Status: DC | PRN
  Administered 2011-07-18: 1000 mL

## 2011-07-18 MED ORDER — ROCURONIUM BROMIDE 100 MG/10ML IV SOLN
INTRAVENOUS | Status: DC | PRN
  Administered 2011-07-18: 40 mg via INTRAVENOUS

## 2011-07-18 MED ORDER — ONDANSETRON HCL 4 MG/2ML IJ SOLN
INTRAMUSCULAR | Status: AC
  Filled 2011-07-18: qty 2

## 2011-07-18 MED ORDER — GLYCOPYRROLATE 0.2 MG/ML IJ SOLN
INTRAMUSCULAR | Status: AC
  Filled 2011-07-18: qty 1

## 2011-07-18 MED ORDER — MORPHINE SULFATE 2 MG/ML IJ SOLN
2.0000 mg | INTRAMUSCULAR | Status: DC | PRN
  Administered 2011-07-18: 2 mg via INTRAVENOUS
  Filled 2011-07-18: qty 1

## 2011-07-18 MED ORDER — LIDOCAINE HCL (PF) 1 % IJ SOLN
INTRAMUSCULAR | Status: AC
  Filled 2011-07-18: qty 5

## 2011-07-18 MED ORDER — PROPOFOL 10 MG/ML IV EMUL
INTRAVENOUS | Status: DC | PRN
  Administered 2011-07-18: 150 mg via INTRAVENOUS

## 2011-07-18 MED ORDER — ROCURONIUM BROMIDE 50 MG/5ML IV SOLN
INTRAVENOUS | Status: AC
  Filled 2011-07-18: qty 1

## 2011-07-18 MED ORDER — GLYCOPYRROLATE 0.2 MG/ML IJ SOLN
0.2000 mg | Freq: Once | INTRAMUSCULAR | Status: AC
  Administered 2011-07-18: 0.2 mg via INTRAVENOUS

## 2011-07-18 MED ORDER — ENOXAPARIN SODIUM 40 MG/0.4ML ~~LOC~~ SOLN
40.0000 mg | Freq: Once | SUBCUTANEOUS | Status: AC
  Administered 2011-07-18: 40 mg via SUBCUTANEOUS

## 2011-07-18 MED ORDER — FENTANYL CITRATE 0.05 MG/ML IJ SOLN
25.0000 ug | INTRAMUSCULAR | Status: DC | PRN
  Administered 2011-07-18 (×2): 50 ug via INTRAVENOUS

## 2011-07-18 MED ORDER — BUPIVACAINE HCL (PF) 0.5 % IJ SOLN
INTRAMUSCULAR | Status: AC
  Filled 2011-07-18: qty 30

## 2011-07-18 MED ORDER — FENTANYL CITRATE 0.05 MG/ML IJ SOLN
INTRAMUSCULAR | Status: DC | PRN
  Administered 2011-07-18: 100 ug via INTRAVENOUS

## 2011-07-18 MED ORDER — SODIUM CHLORIDE 0.9 % IV SOLN: INTRAVENOUS | Status: DC

## 2011-07-18 MED ORDER — IOHEXOL 300 MG/ML  SOLN
100.0000 mL | Freq: Once | INTRAMUSCULAR | Status: AC | PRN
  Administered 2011-07-18: 100 mL via INTRAVENOUS

## 2011-07-18 MED ORDER — FENTANYL CITRATE 0.05 MG/ML IJ SOLN
50.0000 ug | INTRAMUSCULAR | Status: AC
  Administered 2011-07-18: 50 ug via INTRAVENOUS
  Filled 2011-07-18: qty 2

## 2011-07-18 MED ORDER — SODIUM CHLORIDE 0.9 % IV BOLUS (SEPSIS)
1000.0000 mL | Freq: Once | INTRAVENOUS | Status: AC
  Administered 2011-07-18: 1000 mL via INTRAVENOUS

## 2011-07-18 MED ORDER — MIDAZOLAM HCL 2 MG/2ML IJ SOLN
1.0000 mg | INTRAMUSCULAR | Status: DC | PRN
  Administered 2011-07-18 (×2): 2 mg via INTRAVENOUS

## 2011-07-18 MED ORDER — NEOSTIGMINE METHYLSULFATE 1 MG/ML IJ SOLN
INTRAMUSCULAR | Status: DC
Start: 2011-07-18 — End: 2011-07-18
  Filled 2011-07-18: qty 10

## 2011-07-18 MED ORDER — NEOSTIGMINE METHYLSULFATE 1 MG/ML IJ SOLN
INTRAMUSCULAR | Status: DC | PRN
  Administered 2011-07-18: 2 mg via INTRAVENOUS

## 2011-07-18 MED ORDER — FENTANYL CITRATE 0.05 MG/ML IJ SOLN
INTRAMUSCULAR | Status: AC
  Administered 2011-07-18: 50 ug via INTRAVENOUS
  Filled 2011-07-18: qty 5

## 2011-07-18 MED ORDER — MIDAZOLAM HCL 2 MG/2ML IJ SOLN
INTRAMUSCULAR | Status: AC
  Administered 2011-07-18: 2 mg via INTRAVENOUS
  Filled 2011-07-18: qty 2

## 2011-07-18 MED ORDER — FENTANYL CITRATE 0.05 MG/ML IJ SOLN
INTRAMUSCULAR | Status: AC
  Administered 2011-07-18: 50 ug via INTRAVENOUS
  Filled 2011-07-18: qty 2

## 2011-07-18 MED ORDER — LACTATED RINGERS IV SOLN
INTRAVENOUS | Status: DC
  Administered 2011-07-18 (×2): via INTRAVENOUS

## 2011-07-18 SURGICAL SUPPLY — 46 items
BAG HAMPER (MISCELLANEOUS) ×2 IMPLANT
BENZOIN TINCTURE PRP APPL 2/3 (GAUZE/BANDAGES/DRESSINGS) ×2 IMPLANT
CLOTH BEACON ORANGE TIMEOUT ST (SAFETY) ×2 IMPLANT
COVER LIGHT HANDLE STERIS (MISCELLANEOUS) ×4 IMPLANT
CUTTER ENDO LINEAR 45M (STAPLE) ×2 IMPLANT
DECANTER SPIKE VIAL GLASS SM (MISCELLANEOUS) ×2 IMPLANT
DEVICE TROCAR PUNCTURE CLOSURE (ENDOMECHANICALS) ×2 IMPLANT
DURAPREP 26ML APPLICATOR (WOUND CARE) ×2 IMPLANT
ELECT REM PT RETURN 9FT ADLT (ELECTROSURGICAL) ×2
ELECTRODE REM PT RTRN 9FT ADLT (ELECTROSURGICAL) ×1 IMPLANT
FILTER SMOKE EVAC LAPAROSHD (FILTER) ×2 IMPLANT
FORMALIN 10 PREFIL 120ML (MISCELLANEOUS) ×2 IMPLANT
GLOVE BIOGEL PI IND STRL 6.5 (GLOVE) ×1 IMPLANT
GLOVE BIOGEL PI IND STRL 7.0 (GLOVE) ×1 IMPLANT
GLOVE BIOGEL PI IND STRL 7.5 (GLOVE) ×2 IMPLANT
GLOVE BIOGEL PI INDICATOR 6.5 (GLOVE) ×1
GLOVE BIOGEL PI INDICATOR 7.0 (GLOVE) ×1
GLOVE BIOGEL PI INDICATOR 7.5 (GLOVE) ×2
GLOVE ECLIPSE 6.5 STRL STRAW (GLOVE) ×2 IMPLANT
GLOVE ECLIPSE 7.0 STRL STRAW (GLOVE) ×2 IMPLANT
GLOVE EXAM NITRILE MD LF STRL (GLOVE) ×2 IMPLANT
GOWN STRL REIN XL XLG (GOWN DISPOSABLE) ×6 IMPLANT
INST SET LAPROSCOPIC AP (KITS) ×2 IMPLANT
KIT ROOM TURNOVER APOR (KITS) ×2 IMPLANT
MANIFOLD NEPTUNE II (INSTRUMENTS) ×2 IMPLANT
NEEDLE INSUFFLATION 14GA 120MM (NEEDLE) ×2 IMPLANT
NS IRRIG 1000ML POUR BTL (IV SOLUTION) ×2 IMPLANT
PACK LAP CHOLE LZT030E (CUSTOM PROCEDURE TRAY) ×2 IMPLANT
PAD ARMBOARD 7.5X6 YLW CONV (MISCELLANEOUS) ×2 IMPLANT
POUCH SPECIMEN RETRIEVAL 10MM (ENDOMECHANICALS) ×2 IMPLANT
RELOAD 45 VASCULAR/THIN (ENDOMECHANICALS) ×2 IMPLANT
RELOAD STAPLE TA45 3.5 REG BLU (ENDOMECHANICALS) IMPLANT
SEALER TISSUE G2 CVD JAW 35 (ENDOMECHANICALS) ×1 IMPLANT
SEALER TISSUE G2 CVD JAW 45CM (ENDOMECHANICALS) ×1
SET BASIN LINEN APH (SET/KITS/TRAYS/PACK) ×2 IMPLANT
SET TUBE IRRIG SUCTION NO TIP (IRRIGATION / IRRIGATOR) IMPLANT
SLEEVE Z-THREAD 5X100MM (TROCAR) IMPLANT
STRIP CLOSURE SKIN 1/2X4 (GAUZE/BANDAGES/DRESSINGS) ×2 IMPLANT
SUT MNCRL AB 4-0 PS2 18 (SUTURE) ×2 IMPLANT
SUT VIC AB 2-0 CT2 27 (SUTURE) ×4 IMPLANT
TRAY FOLEY CATH 14FR (SET/KITS/TRAYS/PACK) ×2 IMPLANT
TROCAR Z-THAD FIOS HNDL 12X100 (TROCAR) ×2 IMPLANT
TROCAR Z-THRD FIOS HNDL 11X100 (TROCAR) ×2 IMPLANT
TROCAR Z-THREAD FIOS 5X100MM (TROCAR) ×2 IMPLANT
TROCAR Z-THREAD SLEEVE 11X100 (TROCAR) IMPLANT
WARMER LAPAROSCOPE (MISCELLANEOUS) ×2 IMPLANT

## 2011-07-18 NOTE — ED Notes (Signed)
Patient c/o nausea, EDP made aware-verbal orders given.

## 2011-07-18 NOTE — ED Notes (Signed)
Patient was informed a urine sample is needed when she feels like it is possible. Patient stated she would try in a little bit.

## 2011-07-18 NOTE — Anesthesia Procedure Notes (Signed)
Procedure Name: Intubation Date/Time: 07/18/2011 3:23 PM Performed by: Franco Nones Pre-anesthesia Checklist: Patient identified, Patient being monitored, Timeout performed, Emergency Drugs available and Suction available Patient Re-evaluated:Patient Re-evaluated prior to inductionOxygen Delivery Method: Circle System Utilized Preoxygenation: Pre-oxygenation with 100% oxygen Intubation Type: IV induction, Rapid sequence and Cricoid Pressure applied Ventilation: Mask ventilation without difficulty Laryngoscope Size: Miller and 2 Grade View: Grade I Tube type: Oral Tube size: 7.0 mm Number of attempts: 1 Airway Equipment and Method: stylet Placement Confirmation: ETT inserted through vocal cords under direct vision,  positive ETCO2 and breath sounds checked- equal and bilateral Secured at: 21 cm Tube secured with: Tape Dental Injury: Teeth and Oropharynx as per pre-operative assessment

## 2011-07-18 NOTE — Op Note (Signed)
Patient:  Tammy Rivera  DOB:  [birth year]  MRN:  [id number]   Preop Diagnosis:  Acute appendicitis  Postop Diagnosis:  The same  Procedure:  Laparoscopic appendectomy  Surgeon:  Dr. Tilford Pillar  Anes:  General endotracheal, 0.5% Sensorcaine plain for local  Indications:  Patient is a 36 year old female presented to [facility] emergency department with right lower quadrant abdominal pain. Workup and evaluation was consistent for acute appendicitis. Risks benefits alternatives a laparoscopic possible open appendectomy were discussed at length with the patient including but not limited to risk of bleeding, infection, appendiceal stump leak, intraoperative cardiac and pulmonary events. Patient's questions and concerns are addressed the patient was consented for the planned procedure.  Procedure note:  Patient is taken to the or is placed in supine position the or table time the general anesthetic is administered. Once patient was asleep she was endotracheally intubated by the nurse anesthetist. At this point a Foley catheter is placed in standard sterile fashion by the operative staff. Her abdomen is then prepped with DuraPrep solution and draped in standard fashion. A stab incision was created infraumbilically with 11 blade scalpel with additional dissection down to subcuticular tissue carried out using a Coker clamp. The fascia was grasped Coker clamp and elevated anteriorly. A Veress needle is inserted saline drop test is utilized confirm intraperitoneal placement and then pneumoperitoneum was initiated. Once sufficient pneumoperitoneum was obtained an millimeters trochars inserted over a laparoscope allowing visualization of the trocar entering into the peritoneal cavity. At this point the inner cannula was removed the laparoscope was reinserted there is no evidence of any trocar or Veress needle placement injury. At this point the remaining trochars replaced with a 5 mm in the suprapubic region  and a 11 mm in the left lateral abdominal wall. Patient's placed into a Trendelenburg left lateral decubitus position. The cecum was identified and a tiny or fall down to the appendix. The appendix is clearly inflamed and dilated. It is grasped a regular grasper and lifted anteriorly. A window was created behind the mesial appendix between the appendix and mesoappendix at the base of the appendix. This is performed using a Art gallery manager. A 45 Endo GIA stapler load was then utilized to divide the base of the appendix. The mesoappendix is divided using the Enseal bipolar device. At this point the appendix is free is placed into an Endo Catch bag. An Endo Catch bag was placed into the right upper quadrant. Inspection of the appendiceal stump as well as the these appendix didn't demonstrated no evidence of any leaking or bleeding. As quite pleased with the appearance at this time and turned my attention to closure.  Using an Endo Close suture passing device a 2-0 Vicryl sutures passed both the 11 and 12 mm trocar sites. With the sutures in place the appendix was retrieved was removed through the umbilical trocar site and intact Endo Catch bag. It was placed in the back table and sent as a permanent specimen to pathology. At this time the pneumoperitoneum was evacuated. The Vicryl sutures were secured. The local anesthetic is instilled. A 4-0 Monocryl utilized reapproximate the skin edges at all 3 trocar sites. The skin was washed with a moistened dry towel. Benzoin is applied around incision. Half-inch Steri-Strips are placed. The drapes removed the patient left come out of general anesthetic and stretcher back to the PACU in stable condition. At the conclusion of procedure all instrument, sponge, needle counts are correct. Patient tolerated procedure she may well.  Complications:  None  EBL:  Minimal  Specimen:  Appendix

## 2011-07-18 NOTE — ED Provider Notes (Signed)
History   This chart was scribed for Shelda Jakes, MD by Sofie Rower. The patient was seen in room APA09/APA09 and the patient's care was started at 7:32 AM     CSN: [id number]  Arrival date & time 07/18/11  0702   First MD Initiated Contact with Patient 07/18/11 [phone]      Chief Complaint  Patient presents with  . Emesis  . Abdominal Pain    (Consider location/radiation/quality/duration/timing/severity/associated sxs/prior treatment) HPI  Tammy Rivera is a 36 y.o. female who presents to the Emergency Department complaining of moderate, intermittent abdominal pain located at the RLQ onset yesterday (3:00PM) with associated symptoms of vomiting, nausea, fever (99.2), chills. The pt relative states "she has had this pain for months." Pt relative informs EDP that "CT scan performed at [facility] showed enlarged appendix, blood work was ok." Pt states "I have an uncomfortable pain that gets worse, then gets better." The pt describes the pain as a sharp, stinging pain. Pt rates the pain is a 10/10 at present. Pt states the "pain radiates towards the back". Pt has a hx of cholecystectomy (2007), similar symptoms since Christmas (2012).   Pt denies diarrhea, headache, neck pain, back pain, chest pain, SOB, dysuria, rash, allergies to medications.  The pt LNMP was last week.   PCP is Dr. Claudean Kinds Kathryne Sharper, [place]).    Past Surgical History  Procedure Date  . Knee surgery 1997  . Cholecystectomy 2007    Family History  Problem Relation Age of Onset  . Hypertension Mother   . Diabetes Father   . Hypertension Father     History  Substance Use Topics  . Smoking status: Former Smoker    Quit date: 02/04/2004  . Smokeless tobacco: Not on file  . Alcohol Use: No    OB History    Grav Para Term Preterm Abortions TAB SAB Ect Mult Living                  Review of Systems  All other systems reviewed and are negative.    10 Systems reviewed and all are negative for  acute change except as noted in the HPI.    Allergies  Review of patient's allergies indicates no known allergies.  Home Medications   No current outpatient prescriptions on file.  BP 117/61  Pulse 102  Temp 99.2 F (37.3 C) (Oral)  Resp 20  Ht 5\' 6"  (1.676 m)  Wt 165 lb (74.844 kg)  BMI 26.63 kg/m2  SpO2 100%  LMP 07/08/2011  Physical Exam  Nursing note and vitals reviewed. Constitutional: She is oriented to person, place, and time. She appears well-developed and well-nourished.  HENT:  Head: Atraumatic.  Nose: Nose normal.  Mouth/Throat: Oropharynx is clear and moist.  Eyes: Conjunctivae and EOM are normal.  Cardiovascular: Normal rate, regular rhythm and normal heart sounds.  Exam reveals no gallop and no friction rub.   No murmur heard. Pulmonary/Chest: Effort normal and breath sounds normal. No respiratory distress. She has no wheezes. She has no rales.  Abdominal: Bowel sounds are normal. There is tenderness (Diffusely tender.). There is no guarding.  Musculoskeletal: Normal range of motion.  Neurological: She is alert and oriented to person, place, and time.  Skin: Skin is warm and dry.  Psychiatric: She has a normal mood and affect. Her behavior is normal.    ED Course  Procedures (including critical care time)  DIAGNOSTIC STUDIES: Oxygen Saturation is 100% on room air, normal by  my interpretation.    COORDINATION OF CARE:   7:38 AM- EDP at bedside discusses treatment plan concerning cat scan and the pathway of appendicitis.  Results for orders placed during the hospital encounter of 07/18/11  PREGNANCY, URINE      Component Value Range   Preg Test, Ur NEGATIVE  NEGATIVE  URINALYSIS, ROUTINE W REFLEX MICROSCOPIC      Component Value Range   Color, Urine YELLOW  YELLOW   APPearance CLEAR  CLEAR   Specific Gravity, Urine 1.015  1.005 - 1.030   pH 7.0  5.0 - 8.0   Glucose, UA NEGATIVE  NEGATIVE mg/dL   Hgb urine dipstick NEGATIVE  NEGATIVE    Bilirubin Urine NEGATIVE  NEGATIVE   Ketones, ur TRACE (*) NEGATIVE mg/dL   Protein, ur NEGATIVE  NEGATIVE mg/dL   Urobilinogen, UA 0.2  0.0 - 1.0 mg/dL   Nitrite NEGATIVE  NEGATIVE   Leukocytes, UA NEGATIVE  NEGATIVE  CBC      Component Value Range   WBC 11.3 (*) 4.0 - 10.5 K/uL   RBC 4.55  3.87 - 5.11 MIL/uL   Hemoglobin 14.4  12.0 - 15.0 g/dL   HCT 40.9  81.1 - 91.4 %   MCV 91.2  78.0 - 100.0 fL   MCH 31.6  26.0 - 34.0 pg   MCHC 34.7  30.0 - 36.0 g/dL   RDW 78.2  95.6 - 21.3 %   Platelets 215  150 - 400 K/uL  DIFFERENTIAL      Component Value Range   Neutrophils Relative 90 (*) 43 - 77 %   Neutro Abs 10.2 (*) 1.7 - 7.7 K/uL   Lymphocytes Relative 5 (*) 12 - 46 %   Lymphs Abs 0.6 (*) 0.7 - 4.0 K/uL   Monocytes Relative 4  3 - 12 %   Monocytes Absolute 0.5  0.1 - 1.0 K/uL   Eosinophils Relative 0  0 - 5 %   Eosinophils Absolute 0.0  0.0 - 0.7 K/uL   Basophils Relative 0  0 - 1 %   Basophils Absolute 0.0  0.0 - 0.1 K/uL  COMPREHENSIVE METABOLIC PANEL      Component Value Range   Sodium 137  135 - 145 mEq/L   Potassium 3.4 (*) 3.5 - 5.1 mEq/L   Chloride 99  96 - 112 mEq/L   CO2 24  19 - 32 mEq/L   Glucose, Bld 108 (*) 70 - 99 mg/dL   BUN 8  6 - 23 mg/dL   Creatinine, Ser 0.86  0.50 - 1.10 mg/dL   Calcium 9.8  8.4 - 57.8 mg/dL   Total Protein 8.3  6.0 - 8.3 g/dL   Albumin 4.7  3.5 - 5.2 g/dL   AST 16  0 - 37 U/L   ALT 9  0 - 35 U/L   Alkaline Phosphatase 61  39 - 117 U/L   Total Bilirubin 0.9  0.3 - 1.2 mg/dL   GFR calc non Af Amer >90  >90 mL/min   GFR calc Af Amer >90  >90 mL/min  LIPASE, BLOOD      Component Value Range   Lipase 51  11 - 59 U/L   Ct Abdomen Pelvis W Contrast  07/18/2011  *RADIOLOGY REPORT*  Clinical Data: Abdominal pain, right lower quadrant pain  CT ABDOMEN AND PELVIS WITH CONTRAST  Technique:  Multidetector CT imaging of the abdomen and pelvis was performed following the standard protocol during bolus administration of intravenous contrast.  Contrast: OMNIPAQUE IOHEXOL 300 MG/ML  SOLN  Comparison: 02/13/2011  Findings: Lung bases are unremarkable.  Sagittal images of the spine are unremarkable.  Liver, spleen, pancreas and adrenal glands are unremarkable. Kidneys are symmetrical in size and enhancement.  No hydronephrosis or hydroureter.  No focal renal mass.  No small bowel obstruction.  No aortic aneurysm.  There is thickening of the appendix with mild enhancement of the wall and mild stranding of the surrounding fat.  The appendix measures at least 1.2 cm in diameter.  Best visualized in axial image 62 and coronal image 27.  Findings are consistent with acute appendicitis.  Again noted mild congested adnexal vessels left greater than right. Small amount of nonspecific fluid is noted within uterus. Trace free fluid in posterior cul-de-sac.  The urinary bladder is unremarkable.  No destructive bony lesions are noted within pelvis.  In axial image 63 there is a cyst / follicle within the left ovary measures 2.2 cm.  IMPRESSION:  1.  There is thickening of the appendix with mild enhancement of the wall.  Appendix measures at least in diameter. Findings are consistent with acute appendicitis. 2.  Again noted congested adnexal vessels left greater than right. A cyst / follicle in the left ovary measures 2.2 cm. 3.  No small bowel obstruction.  No hydronephrosis or hydroureter.  I discussed with Dr. Deretha Emory from emergency room.  Original Report Authenticated By: Natasha Mead, M.D.      1. Acute appendicitis       MDM   CT scan with changes compared to the January CT scan now consistent with acute appendicitis. Symptoms should of started yesterday afternoon around 3 PM. Will contact Gen. Surgery on-call for admission. Discuss with radiology the patient's history of some chronic pain in the right lower quadrant no other significant findings to explain that at this time.     I personally performed the services described in this  documentation, which was scribed in my presence. The recorded information has been reviewed and considered.     Shelda Jakes, MD 07/18/11 [phone]

## 2011-07-18 NOTE — ED Notes (Signed)
Family at bedside. 

## 2011-07-18 NOTE — ED Notes (Signed)
Patient c/o headache, EDP made aware-no further orders given at this time. Patient aware.

## 2011-07-18 NOTE — Anesthesia Preprocedure Evaluation (Signed)
Anesthesia Evaluation  Patient identified by MRN, date of birth, ID band Patient awake    Reviewed: Allergy & Precautions, H&P , NPO status , Patient's Chart, lab work & pertinent test results  Airway Mallampati: I TM Distance: >3 FB Neck ROM: Full    Dental No notable dental hx.    Pulmonary former smoker   Pulmonary exam normal       Cardiovascular negative cardio ROS  Rhythm:Regular Rate:Tachycardia     Neuro/Psych PSYCHIATRIC DISORDERS negative neurological ROS     GI/Hepatic negative GI ROS, Neg liver ROS,   Endo/Other  negative endocrine ROS  Renal/GU negative Renal ROS     Musculoskeletal negative musculoskeletal ROS (+)   Abdominal (+)  Abdomen: tender.    Peds  Hematology negative hematology ROS (+)   Anesthesia Other Findings   Reproductive/Obstetrics negative OB ROS                           Anesthesia Physical Anesthesia Plan  ASA: I and Emergent  Anesthesia Plan: General   Post-op Pain Management:    Induction: Intravenous, Rapid sequence and Cricoid pressure planned  Airway Management Planned: Oral ETT  Additional Equipment:   Intra-op Plan:   Post-operative Plan: Extubation in OR  Informed Consent: I have reviewed the patients History and Physical, chart, labs and discussed the procedure including the risks, benefits and alternatives for the proposed anesthesia with the patient or authorized representative who has indicated his/her understanding and acceptance.   Dental advisory given  Plan Discussed with: CRNA  Anesthesia Plan Comments:         Anesthesia Quick Evaluation

## 2011-07-18 NOTE — ED Notes (Signed)
Pt states severe right lower quad pain since yesterday afternoon. Pt vomiting as well.

## 2011-07-18 NOTE — H&P (Signed)
Tammy Rivera is an 36 y.o. female.   Chief Complaint: Right lower quadrant abdominal pain. HPI: Patient states pain started yesterday afternoon/evening in the right lower quadrant. The pain has persisted in that area. It has significantly progressed since that time. She has had associated nausea and nonbloody emesis. She has had subjective fevers and chills. She denies any change in bowel movements. No melena no match easy. Her appetite has been poor since the onset of the pain. Her last meal was yesterday. She does state she feels like she's had similar pain in this area previously but no history of appendicitis that she is aware of. Unusual contacts. No unusual travel. No unusual food exposure   History reviewed. No pertinent past medical history.  Past Surgical History  Procedure Date  . Knee surgery 1997    right knee  . Cholecystectomy 2007    Family History  Problem Relation Age of Onset  . Hypertension Mother   . Diabetes Father   . Hypertension Father    Social History:  reports that she quit smoking about 7 years ago. She does not have any smokeless tobacco history on file. She reports that she does not drink alcohol or use illicit drugs.  Allergies: No Known Allergies  No prescriptions prior to admission    Results for orders placed during the hospital encounter of 07/18/11 (from the past 48 hour(s))  CBC     Status: Abnormal   Collection Time   07/18/11  7:57 AM      Component Value Range Comment   WBC 11.3 (*) 4.0 - 10.5 K/uL    RBC 4.55  3.87 - 5.11 MIL/uL    Hemoglobin 14.4  12.0 - 15.0 g/dL    HCT 16.1  09.6 - 04.5 %    MCV 91.2  78.0 - 100.0 fL    MCH 31.6  26.0 - 34.0 pg    MCHC 34.7  30.0 - 36.0 g/dL    RDW 40.9  81.1 - 91.4 %    Platelets 215  150 - 400 K/uL   DIFFERENTIAL     Status: Abnormal   Collection Time   07/18/11  7:57 AM      Component Value Range Comment   Neutrophils Relative 90 (*) 43 - 77 %    Neutro Abs 10.2 (*) 1.7 - 7.7 K/uL    Lymphocytes Relative 5 (*) 12 - 46 %    Lymphs Abs 0.6 (*) 0.7 - 4.0 K/uL    Monocytes Relative 4  3 - 12 %    Monocytes Absolute 0.5  0.1 - 1.0 K/uL    Eosinophils Relative 0  0 - 5 %    Eosinophils Absolute 0.0  0.0 - 0.7 K/uL    Basophils Relative 0  0 - 1 %    Basophils Absolute 0.0  0.0 - 0.1 K/uL   COMPREHENSIVE METABOLIC PANEL     Status: Abnormal   Collection Time   07/18/11  7:57 AM      Component Value Range Comment   Sodium 137  135 - 145 mEq/L    Potassium 3.4 (*) 3.5 - 5.1 mEq/L    Chloride 99  96 - 112 mEq/L    CO2 24  19 - 32 mEq/L    Glucose, Bld 108 (*) 70 - 99 mg/dL    BUN 8  6 - 23 mg/dL    Creatinine, Ser 7.82  0.50 - 1.10 mg/dL    Calcium 9.8  8.4 - 10.5 mg/dL    Total Protein 8.3  6.0 - 8.3 g/dL    Albumin 4.7  3.5 - 5.2 g/dL    AST 16  0 - 37 U/L    ALT 9  0 - 35 U/L    Alkaline Phosphatase 61  39 - 117 U/L    Total Bilirubin 0.9  0.3 - 1.2 mg/dL    GFR calc non Af Amer >90  >90 mL/min    GFR calc Af Amer >90  >90 mL/min   LIPASE, BLOOD     Status: Normal   Collection Time   07/18/11  7:57 AM      Component Value Range Comment   Lipase 51  11 - 59 U/L   PREGNANCY, URINE     Status: Normal   Collection Time   07/18/11  8:49 AM      Component Value Range Comment   Preg Test, Ur NEGATIVE  NEGATIVE   URINALYSIS, ROUTINE W REFLEX MICROSCOPIC     Status: Abnormal   Collection Time   07/18/11  8:49 AM      Component Value Range Comment   Color, Urine YELLOW  YELLOW    APPearance CLEAR  CLEAR    Specific Gravity, Urine 1.015  1.005 - 1.030    pH 7.0  5.0 - 8.0    Glucose, UA NEGATIVE  NEGATIVE mg/dL    Hgb urine dipstick NEGATIVE  NEGATIVE    Bilirubin Urine NEGATIVE  NEGATIVE    Ketones, ur TRACE (*) NEGATIVE mg/dL    Protein, ur NEGATIVE  NEGATIVE mg/dL    Urobilinogen, UA 0.2  0.0 - 1.0 mg/dL    Nitrite NEGATIVE  NEGATIVE    Leukocytes, UA NEGATIVE  NEGATIVE MICROSCOPIC NOT DONE ON URINES WITH NEGATIVE PROTEIN, BLOOD, LEUKOCYTES, NITRITE, OR GLUCOSE  <1000 mg/dL.   Ct Abdomen Pelvis W Contrast  07/18/2011  *RADIOLOGY REPORT*  Clinical Data: Abdominal pain, right lower quadrant pain  CT ABDOMEN AND PELVIS WITH CONTRAST  Technique:  Multidetector CT imaging of the abdomen and pelvis was performed following the standard protocol during bolus administration of intravenous contrast.  Contrast: OMNIPAQUE IOHEXOL 300 MG/ML  SOLN  Comparison: 02/13/2011  Findings: Lung bases are unremarkable.  Sagittal images of the spine are unremarkable.  Liver, spleen, pancreas and adrenal glands are unremarkable. Kidneys are symmetrical in size and enhancement.  No hydronephrosis or hydroureter.  No focal renal mass.  No small bowel obstruction.  No aortic aneurysm.  There is thickening of the appendix with mild enhancement of the wall and mild stranding of the surrounding fat.  The appendix measures at least 1.2 cm in diameter.  Best visualized in axial image 62 and coronal image 27.  Findings are consistent with acute appendicitis.  Again noted mild congested adnexal vessels left greater than right. Small amount of nonspecific fluid is noted within uterus. Trace free fluid in posterior cul-de-sac.  The urinary bladder is unremarkable.  No destructive bony lesions are noted within pelvis.  In axial image 63 there is a cyst / follicle within the left ovary measures 2.2 cm.  IMPRESSION:  1.  There is thickening of the appendix with mild enhancement of the wall.  Appendix measures at least in diameter. Findings are consistent with acute appendicitis. 2.  Again noted congested adnexal vessels left greater than right. A cyst / follicle in the left ovary measures 2.2 cm. 3.  No small bowel obstruction.  No hydronephrosis or hydroureter.  I discussed with Dr. Deretha Emory from emergency room.  Original Report Authenticated By: Natasha Mead, M.D.    Review of Systems  Constitutional: Positive for fever and chills.  Eyes: Negative.   Respiratory: Negative.   Cardiovascular:  Negative.   Gastrointestinal: Positive for heartburn, nausea, vomiting and abdominal pain. Negative for diarrhea (Right lower quadrant), constipation, blood in stool and melena.  Genitourinary: Negative.   Musculoskeletal: Negative.   Skin: Negative.   Neurological: Positive for headaches.  Endo/Heme/Allergies: Negative.   Psychiatric/Behavioral: Negative.     Blood pressure 128/78, pulse 109, temperature 100.3 F (37.9 C), temperature source Oral, resp. rate 17, height 5\' 6"  (1.676 m), weight 74.844 kg (165 lb), last menstrual period 07/08/2011, SpO2 97.00%. Physical Exam  Constitutional: She is oriented to person, place, and time. She appears well-developed and well-nourished. No distress.       Uncomfortable  HENT:  Head: Normocephalic and atraumatic.  Eyes: Conjunctivae and EOM are normal. Pupils are equal, round, and reactive to light.  Neck: Normal range of motion. Neck supple. No tracheal deviation present. No thyromegaly present.  Cardiovascular: Normal rate, regular rhythm and normal heart sounds.   Respiratory: Effort normal and breath sounds normal. No respiratory distress. She has no wheezes.  GI: Soft. She exhibits no distension and no mass. There is tenderness (positive right lower quadrant pain at McBurney's point. Positive Rovsing sign.). There is rebound and guarding.  Musculoskeletal: Normal range of motion.  Lymphadenopathy:    She has no cervical adenopathy.  Neurological: She is alert and oriented to person, place, and time.  Skin: Skin is warm and dry.     Assessment/Plan Acute appendicitis. At this point the patient will be kept in a by mouth status. Continue IV fluid hydration. IV antibiotics will be administered. DVT prophylaxis will be administered. Risks benefits alternatives of an urgent/emergent laparoscopic appendectomy were discussed at length the patient including but not limited to risk of bleeding, infection, appendiceal stump leak, intraoperative  cardiac palm events. Patient's questions and concerns addressed and we'll plan to proceed to the operating room.  M[place] C 07/18/2011, 12:25 PM

## 2011-07-18 NOTE — ED Notes (Signed)
Patient states she is still having horrible pain. RN Viviann Spare notified.

## 2011-07-18 NOTE — Transfer of Care (Signed)
Immediate Anesthesia Transfer of Care Note  Patient: Tammy Rivera  Procedure(s) Performed: Procedure(s) (LRB): APPENDECTOMY LAPAROSCOPIC (N/A)  Patient Location: PACU  Anesthesia Type: General  Level of Consciousness: awake  Airway & Oxygen Therapy: Patient Spontanous Breathing and non-rebreather face mask  Post-op Assessment: Report given to PACU RN, Post -op Vital signs reviewed and stable and Patient moving all extremities  Post vital signs: Reviewed and stable  Complications: No apparent anesthesia complications

## 2011-07-18 NOTE — ED Notes (Signed)
Report given to OR.

## 2011-07-18 NOTE — Anesthesia Postprocedure Evaluation (Signed)
Anesthesia Post Note  Patient: Tammy Rivera  Procedure(s) Performed: Procedure(s) (LRB): APPENDECTOMY LAPAROSCOPIC (N/A)  Anesthesia type: General  Patient location: PACU  Post pain: Pain level controlled  Post assessment: Post-op Vital signs reviewed, Patient's Cardiovascular Status Stable, Respiratory Function Stable, Patent Airway, No signs of Nausea or vomiting and Pain level controlled  Last Vitals:  Filed Vitals:   07/18/11 1617  BP: 127/64  Pulse: 87  Temp: 37.2 C  Resp: 18    Post vital signs: Reviewed and stable  Level of consciousness: awake and alert   Complications: No apparent anesthesia complications

## 2011-07-18 NOTE — ED Notes (Signed)
Family at bedside. Patient does not need anything at this time. 

## 2011-07-21 NOTE — Discharge Summary (Signed)
Physician Discharge Summary  Patient ID: Tammy Rivera MRN: [id number] DOB/AGE: 05-16-1975 36 y.o.  Admit date: 07/18/2011 Discharge date: 07/18/2011  Admission Diagnoses: Acute appendicitis  Discharge Diagnoses: The same Active Problems:  * No active hospital problems. *    Discharged Condition: stable  Hospital Course: Patient presented to [facility] emergency department with right lower quadrant abdominal pain. Workup and evaluation was consistent for acute appendicitis. Patient was taken to the operating room the same day for the laparoscopic appendectomy. She tolerated procedure extremely well. She was advanced to a diet. Pain was controlled. Plans were made for discharge.  Consults: None  Significant Diagnostic Studies: radiology: CT scan: Abdomen and pelvis  Treatments: surgery: Laparoscopic appendectomy  Discharge Exam: Blood pressure 124/66, pulse 90, temperature 97.6 F (36.4 C), temperature source Oral, resp. rate 16, height 5\' 6"  (1.676 m), weight 74.844 kg (165 lb), last menstrual period 07/08/2011, SpO2 100.00%. General appearance: alert and no distress Resp: clear to auscultation bilaterally Cardio: regular rate and rhythm GI: Bowel sounds, soft, expected tenderness. Incisions are clean dry and intact.  Disposition: 01-Home or Self Care  Discharge Orders    Future Orders Please Complete By Expires   Diet - low sodium heart healthy      Increase activity slowly      Discharge instructions      Comments:   Increase activity as tolerated. May place ice pack for comfort.  Alternate an anti-inflammatory such as ibuprofen (Motrin, Advil) 400-600mg  every 6 hours with the prescribed pain medication.   Do not take any additional acetaminophen as there is Tylenol in the pain medication.   Driving Restrictions      Comments:   No driving while on pain medications.   Lifting restrictions      Comments:   No lifting over 20lbs for 4-5 weeks post-op.   Discharge  wound care:      Comments:   Clean surgical sites with soap and water.  May shower the morning after surgery unless instructed by Dr. Leticia Penna otherwise.  No soaking for 2-3 weeks.    If adhesive strips are in place, they may be removed in 1-2 weeks while in the shower.   Call MD for:  temperature >100.4      Call MD for:  persistant nausea and vomiting      Call MD for:  severe uncontrolled pain      Call MD for:  redness, tenderness, or signs of infection (pain, swelling, redness, odor or green/yellow discharge around incision site)        Medication List  As of 07/21/2011 11:42 AM   TAKE these medications         HYDROcodone-acetaminophen 5-325 MG per tablet   Commonly known as: NORCO   Take 1-2 tablets by mouth every 4 (four) hours as needed for pain.           Follow-up Information    Follow up with Leily Capek C, MD in 3 weeks.   Contact information:   [address] [place] [address] [phone]          Signed: Fabio Bering 07/21/2011, 11:42 AM

## 2011-07-23 NOTE — Progress Notes (Signed)
UR Chart Review Completed  

## 2011-07-28 ENCOUNTER — Encounter (HOSPITAL_COMMUNITY): Payer: Self-pay | Admitting: General Surgery

## 2012-06-07 ENCOUNTER — Ambulatory Visit (INDEPENDENT_AMBULATORY_CARE_PROVIDER_SITE_OTHER): Payer: 59 | Admitting: Family Medicine

## 2012-06-07 ENCOUNTER — Encounter: Payer: Self-pay | Admitting: Family Medicine

## 2012-06-07 VITALS — BP 138/90 | HR 76 | Wt 171.0 lb

## 2012-06-07 DIAGNOSIS — R21 Rash and other nonspecific skin eruption: Secondary | ICD-10-CM

## 2012-06-07 MED ORDER — LIDOCAINE 4 % EX GEL: 1.0000 "application " | Freq: Three times a day (TID) | CUTANEOUS | Status: DC | PRN

## 2012-06-07 MED ORDER — VALACYCLOVIR HCL 1 G PO TABS: 1000.0000 mg | ORAL_TABLET | Freq: Two times a day (BID) | ORAL | Status: DC

## 2012-06-07 NOTE — Patient Instructions (Signed)

## 2012-06-07 NOTE — Progress Notes (Signed)
  Subjective:    Patient ID: Tammy Rivera, female    DOB: [birth year], 37 y.o.   MRN: [id number]  HPI Noticed rash about a week ago, says skin was was tender the week before that .  She felt like the had fever and aches and then noticed the rash the next day.  On her left low back and hip.  Then noticed spot on left inner thigh. Says not painful initially burt now feels sore and itchy and stinging. Did have chicken pox as a child.  has been using ibuprofen for pain relief. Helps some. His been difficult to fall asleep and get comfortable at night. No drainage from the wounds. She says she's been trying not to scratch them. She's not tried any over-the-counter treatments.    Review of Systems     Objective:   Physical Exam  Skin:     Discrete purple vesicular lesions with surrounding erythema.            Assessment & Plan:  Rash - Can be shingles vx herpes virus. Culture obtained.  All once available. For now we'll go ahead and start valacyclovir. Call if not significantly better. We'll also send over a topical lidocaine gel to use especially at bedtime for pain relief. Can continue over-the-counter ibuprofen and Tylenol as well.

## 2012-06-14 LAB — HERPES CULTURE, RAPID

## 2012-11-18 IMAGING — CT CT ABD-PELV W/ CM
2 of 3 series · 16 of 46 positions shown, 18 images · IV contrast (omnipaque)
Comparison: 02/13/2011

CLINICAL DATA: Abdominal pain, right lower quadrant pain

CT ABDOMEN AND PELVIS WITH CONTRAST
TECHNIQUE: Multidetector CT imaging of the abdomen and pelvis was
performed following the standard protocol during bolus
administration of intravenous contrast.
Contrast: 100mL OMNIPAQUE IOHEXOL 300 MG/ML  SOLN

[Series 2: abd_pel_with 5.0 b40f · axial · 0.68mm/px · z∈[+322,+727]mm · 13 of 93 slices shown, 15 images]
[im 6/93  soft-tissue]
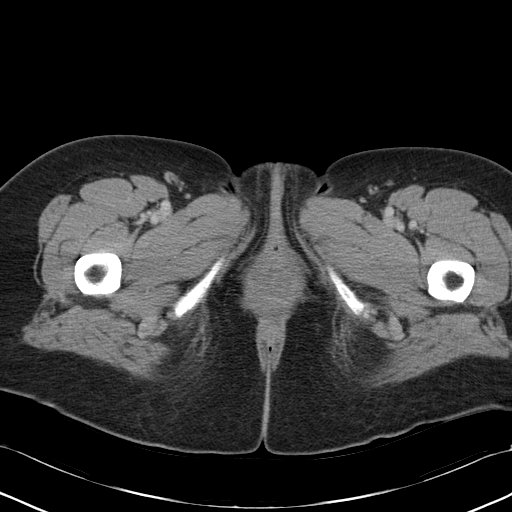
[im 6/93  bone]
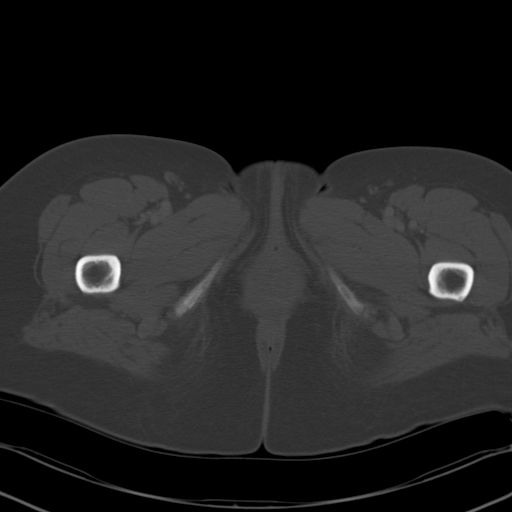
[im 12/93  soft-tissue]
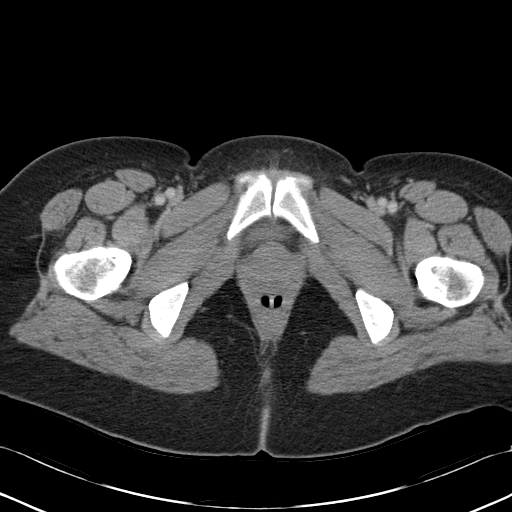
[im 18/93  soft-tissue]
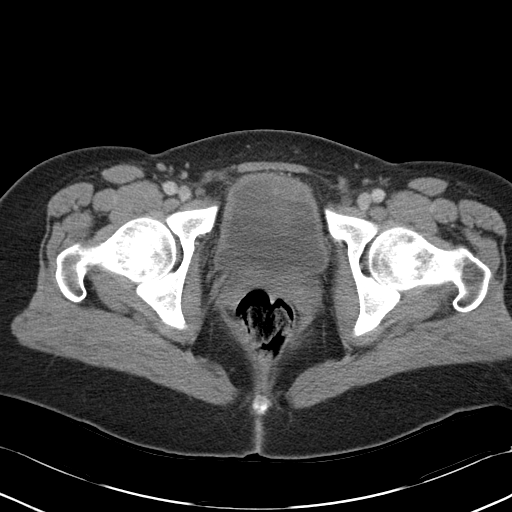
[im 27/93  soft-tissue]
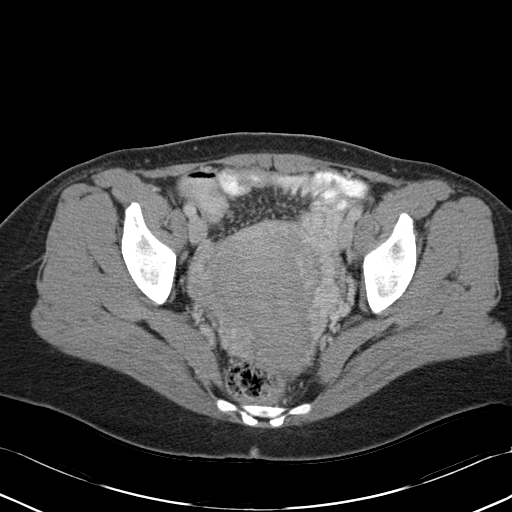
[im 33/93  soft-tissue]
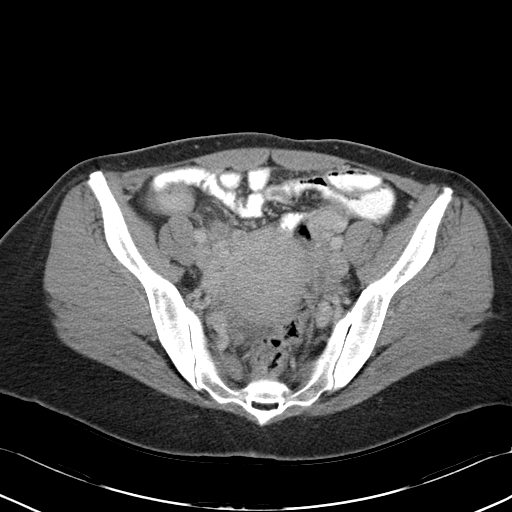
[im 39/93  soft-tissue]
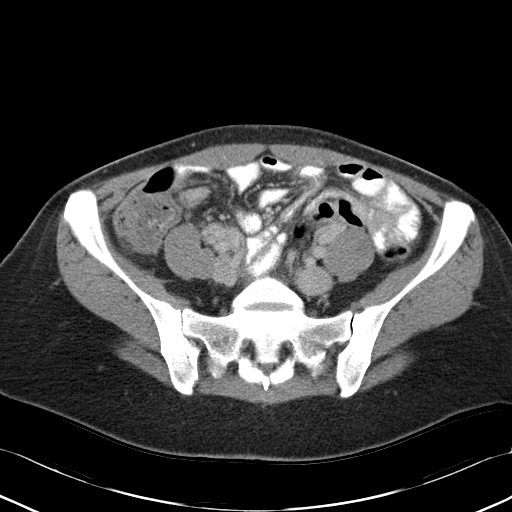
[im 48/93  soft-tissue]
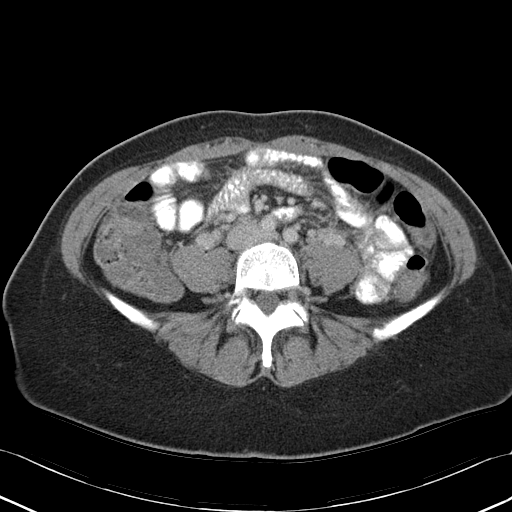
[im 54/93  soft-tissue]
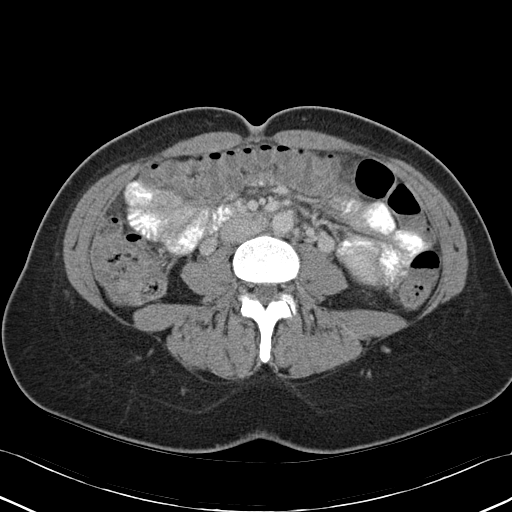
[im 60/93  soft-tissue]
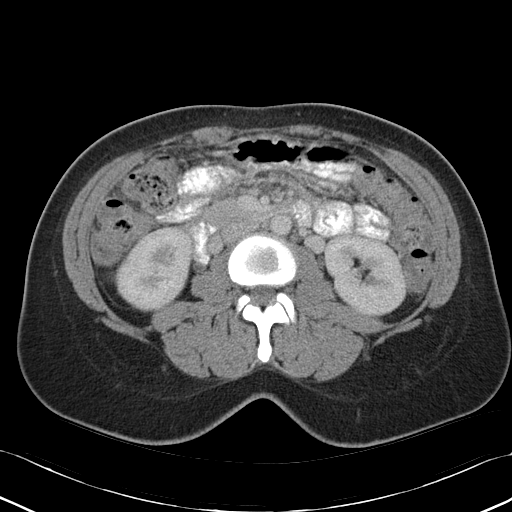
[im 60/93  bone]
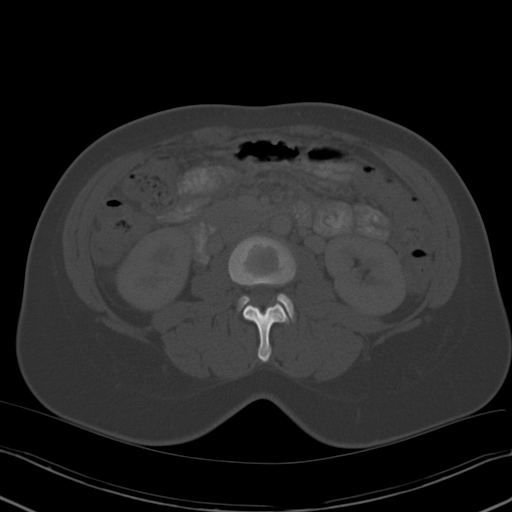
[im 66/93  soft-tissue]
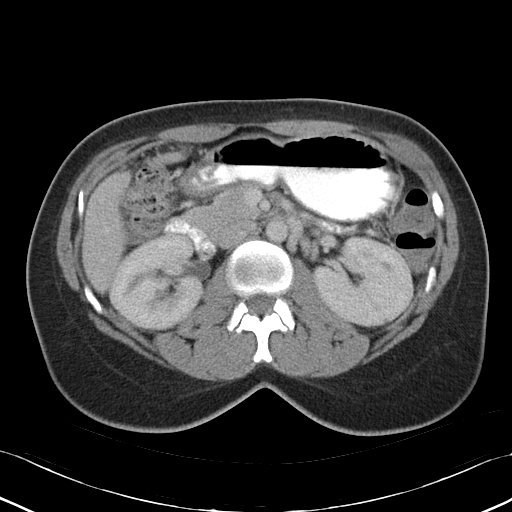
[im 75/93  soft-tissue]
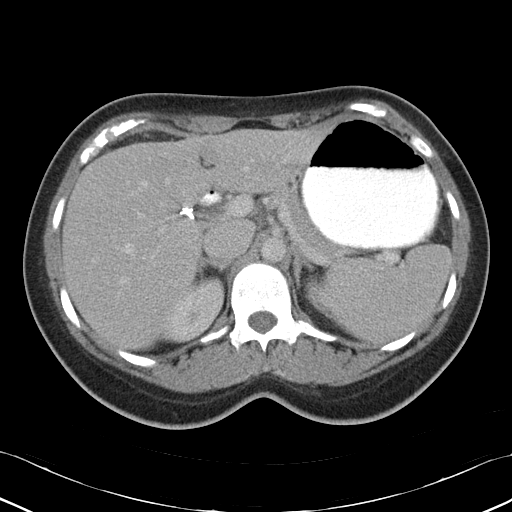
[im 81/93  soft-tissue]
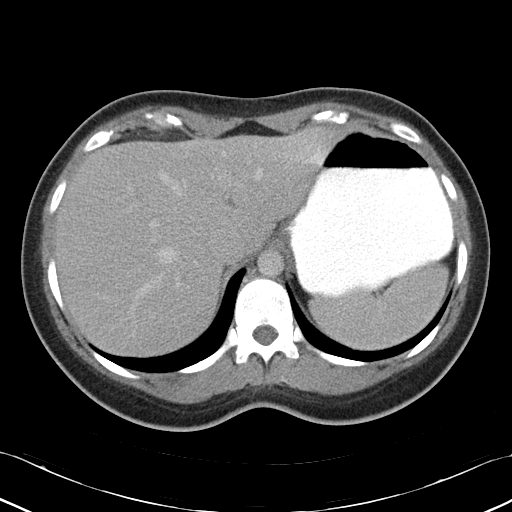
[im 87/93  soft-tissue]
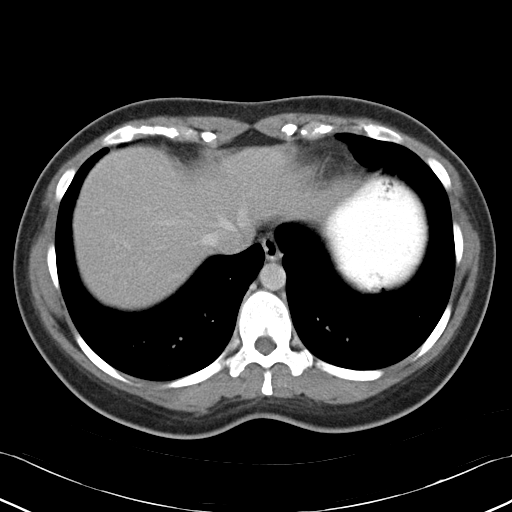

[Series 4: abd_pel_with 3.0 spo cor · coronal · 0.61mm/px · 3 of 71 slices shown]
[im 24/71  soft-tissue]
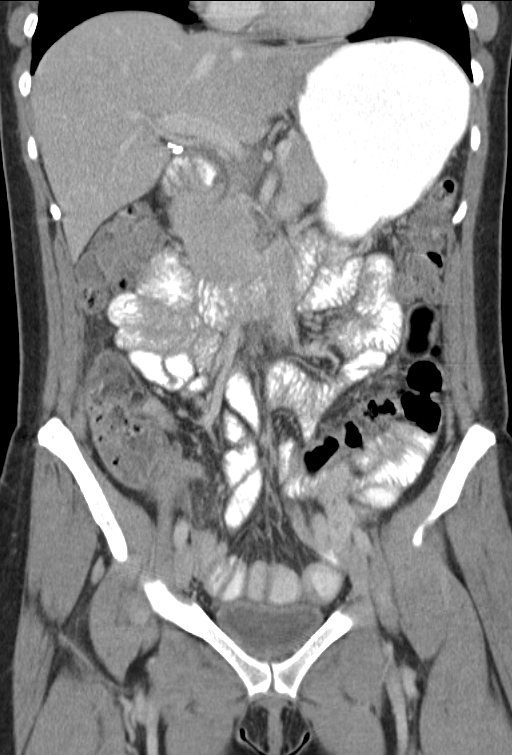
[im 32/71  soft-tissue]
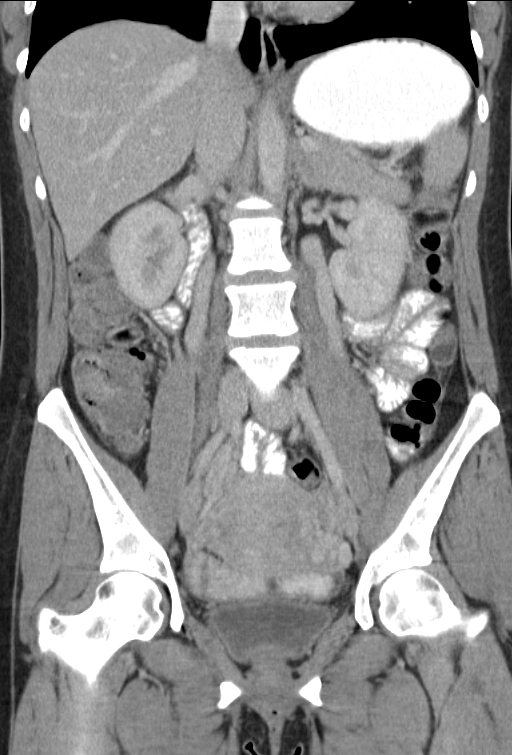
[im 39/71  soft-tissue]
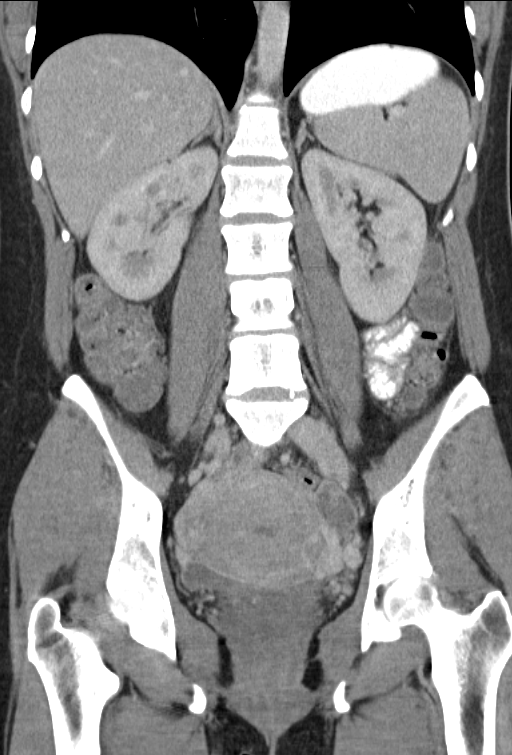

[16 of 46 positions shown; findings below may reference images not displayed]

FINDINGS: Lung bases are unremarkable.  Sagittal images of the
spine are unremarkable.

Liver, spleen, pancreas and adrenal glands are unremarkable.
Kidneys are symmetrical in size and enhancement.  No hydronephrosis
or hydroureter.  No focal renal mass.

No small bowel obstruction.  No aortic aneurysm.  There is
thickening of the appendix with mild enhancement of the wall and
mild stranding of the surrounding fat.  The appendix measures at
least 1.2 cm in diameter.  Best visualized in axial image 62 and
coronal image 27.  Findings are consistent with acute appendicitis.

Again noted mild congested adnexal vessels left greater than right.
Small amount of nonspecific fluid is noted within uterus. Trace
free fluid in posterior cul-de-sac.  The urinary bladder is
unremarkable.  No destructive bony lesions are noted within pelvis.

In axial image 63 there is a cyst / follicle within the left ovary
measures 2.2 cm.
IMPRESSION: 1.  There is thickening of the appendix with mild enhancement of
the wall.  Appendix measures at least in diameter. Findings are
consistent with acute appendicitis.
2.  Again noted congested adnexal vessels left greater than right.
A cyst / follicle in the left ovary measures 2.2 cm.
3.  No small bowel obstruction.  No hydronephrosis or hydroureter.

I discussed with Dr. Zeinab from emergency room.

## 2013-03-09 ENCOUNTER — Ambulatory Visit (INDEPENDENT_AMBULATORY_CARE_PROVIDER_SITE_OTHER): Payer: 59 | Admitting: Physician Assistant

## 2013-03-09 ENCOUNTER — Encounter: Payer: Self-pay | Admitting: Physician Assistant

## 2013-03-09 VITALS — BP 128/72 | HR 98 | Temp 98.0°F | Wt 188.0 lb

## 2013-03-09 DIAGNOSIS — J329 Chronic sinusitis, unspecified: Secondary | ICD-10-CM

## 2013-03-09 DIAGNOSIS — A499 Bacterial infection, unspecified: Secondary | ICD-10-CM

## 2013-03-09 DIAGNOSIS — B9689 Other specified bacterial agents as the cause of diseases classified elsewhere: Secondary | ICD-10-CM

## 2013-03-09 MED ORDER — AMOXICILLIN 500 MG PO CAPS: 500.0000 mg | ORAL_CAPSULE | Freq: Two times a day (BID) | ORAL | Status: DC

## 2013-03-09 NOTE — Progress Notes (Signed)
   Subjective:    Patient ID: Tammy Rivera, female    DOB: 10-Jul-1975, 38 y.o.   MRN: [id number]  HPI .SINUSITIS  Onset: Thanksgiving   Severity: Moderate Worse with: NOthing Better with: Nothing. Tried ibuprofen, mucinex.  Symptoms Cough: no Runny nose: no Fever: no   Highest Temp: none Sinus Pressure: yes  Ears Blocked: yes  Teeth Ache: yes  Frontal Headache: yes  Second sickening: yes   PMH Sinusitis or Recurrent OM: no  PMH Prior Sinus or Ear Surgery: no  Recent antibiotic usage (last 30 days): no  PMH of Diabetes or Immunocompromise: no    Red flags Change in mental state: no Change in vision: no Rash: no     Review of Systems     Objective:   Physical Exam  Constitutional: She is oriented to person, place, and time. She appears well-developed and well-nourished.  HENT:  Head: Normocephalic and atraumatic.  Right Ear: External ear normal.  Left Ear: External ear normal.  Mouth/Throat: Oropharynx is clear and moist.  TMs clear bilaterally.  Bilateral maxillary sinus tenderness to palpation.  Bilateral nasal turbinates red and swollen.  Eyes: Conjunctivae are normal. Right eye exhibits no discharge. Left eye exhibits no discharge.  Neck: Normal range of motion. Neck supple.  Cardiovascular: Normal rate, regular rhythm and normal heart sounds.   Pulmonary/Chest: Effort normal and breath sounds normal.  Lymphadenopathy:    She has no cervical adenopathy.  Neurological: She is alert and oriented to person, place, and time.  Skin: Skin is dry.  Psychiatric: She has a normal mood and affect. Her behavior is normal.          Assessment & Plan:  Bacterial sinusitis-treated with Amoxil for 10 days. Gave handout for symptomatic care. Call if not improving.

## 2013-03-09 NOTE — Patient Instructions (Signed)

## 2014-09-11 ENCOUNTER — Ambulatory Visit (INDEPENDENT_AMBULATORY_CARE_PROVIDER_SITE_OTHER): Payer: 59 | Admitting: Family Medicine

## 2014-09-11 ENCOUNTER — Encounter: Payer: Self-pay | Admitting: Family Medicine

## 2014-09-11 ENCOUNTER — Other Ambulatory Visit (HOSPITAL_COMMUNITY)
Admission: RE | Admit: 2014-09-11 | Discharge: 2014-09-11 | Disposition: A | Discharge status: Home / Self Care | Payer: 59 | Source: Ambulatory Visit | Attending: Family Medicine | Admitting: Family Medicine

## 2014-09-11 VITALS — BP 129/77 | HR 75 | Ht 66.0 in | Wt 191.0 lb

## 2014-09-11 DIAGNOSIS — Z1151 Encounter for screening for human papillomavirus (HPV): Secondary | ICD-10-CM | POA: Insufficient documentation

## 2014-09-11 DIAGNOSIS — Z124 Encounter for screening for malignant neoplasm of cervix: Secondary | ICD-10-CM | POA: Diagnosis not present

## 2014-09-11 DIAGNOSIS — Z01419 Encounter for gynecological examination (general) (routine) without abnormal findings: Secondary | ICD-10-CM | POA: Insufficient documentation

## 2014-09-11 DIAGNOSIS — Z114 Encounter for screening for human immunodeficiency virus [HIV]: Secondary | ICD-10-CM

## 2014-09-11 DIAGNOSIS — Z0000 Encounter for general adult medical examination without abnormal findings: Secondary | ICD-10-CM | POA: Diagnosis not present

## 2014-09-11 LAB — COMPLETE METABOLIC PANEL WITH GFR
ALK PHOS: 56 U/L (ref 33–115)
ALT: 8 U/L (ref 6–29)
AST: 13 U/L (ref 10–30)
Albumin: 4.5 g/dL (ref 3.6–5.1)
BILIRUBIN TOTAL: 0.4 mg/dL (ref 0.2–1.2)
BUN: 10 mg/dL (ref 7–25)
CALCIUM: 8.9 mg/dL (ref 8.6–10.2)
CO2: 26 mmol/L (ref 20–31)
Chloride: 102 mmol/L (ref 98–110)
Creat: 0.72 mg/dL (ref 0.50–1.10)
Glucose, Bld: 94 mg/dL (ref 65–99)
POTASSIUM: 3.9 mmol/L (ref 3.5–5.3)
Sodium: 140 mmol/L (ref 135–146)
Total Protein: 7 g/dL (ref 6.1–8.1)

## 2014-09-11 LAB — LIPID PANEL
CHOL/HDL RATIO: 2.6 ratio (ref <=5.0)
CHOLESTEROL: 153 mg/dL (ref 125–200)
HDL: 59 mg/dL (ref >=46)
LDL Cholesterol: 82 mg/dL (ref <130)
Triglycerides: 59 mg/dL (ref <150)
VLDL: 12 mg/dL (ref <30)

## 2014-09-11 NOTE — Patient Instructions (Signed)
Keep up a regular exercise program and make sure you are eating a healthy diet Try to eat 4 servings of dairy a day, or if you are lactose intolerant take a calcium with vitamin D daily.  Your vaccines are up to date.   

## 2014-09-11 NOTE — Progress Notes (Signed)
  Subjective:     Tammy Rivera is a 39 y.o. female and is here for a comprehensive physical exam. The patient reports no problems. She has a biometric screen that needs to be completed for work.   History   Social History  . Marital Status: Married    Spouse Name: N/A  . Number of Children: N/A  . Years of Education: N/A   Occupational History  . Not on file.   Social History Main Topics  . Smoking status: Former Smoker    Quit date: 02/04/2004  . Smokeless tobacco: Not on file  . Alcohol Use: No  . Drug Use: No  . Sexual Activity: Yes    Birth Control/ Protection: None   Other Topics Concern  . Not on file   Social History Narrative   Health Maintenance  Topic Date Due  . HIV Screening  09/26/1992  . PAP SMEAR  03/14/2007  . INFLUENZA VACCINE  09/04/2014  . TETANUS/TDAP  04/15/2020    The following portions of the patient's history were reviewed and updated as appropriate: allergies, current medications, past family history, past medical history, past social history, past surgical history and problem list.  Review of Systems A comprehensive review of systems was negative.   Objective:    BP 129/77 mmHg  Pulse 75  Ht  (1.676 m)  Wt 191 lb (86.637 kg)  BMI 30.84 kg/m2  LMP 09/04/2014 General appearance: alert, cooperative and appears stated age Head: Normocephalic, without obvious abnormality, atraumatic Eyes: conj clear, EOMI, PEERLA Ears: normal TM's and external ear canals both ears Nose: Nares normal. Septum midline. Mucosa normal. No drainage or sinus tenderness. Throat: lips, mucosa, and tongue normal; teeth and gums normal Neck: no adenopathy, no carotid bruit, no JVD, supple, symmetrical, trachea midline and thyroid not enlarged, symmetric, no tenderness/mass/nodules Back: symmetric, no curvature. ROM normal. No CVA tenderness. Lungs: clear to auscultation bilaterally Breasts: normal appearance, no masses or tenderness Heart: regular rate and  rhythm, S1, S2 normal, no murmur, click, rub or gallop Abdomen: soft, non-tender; bowel sounds normal; no masses,  no organomegaly Pelvic: cervix normal in appearance, external genitalia normal, no adnexal masses or tenderness, no cervical motion tenderness, rectovaginal septum normal, uterus normal size, shape, and consistency and vagina normal without discharge Extremities: extremities normal, atraumatic, no cyanosis or edema Pulses: 2+ and symmetric Skin: Skin color, texture, turgor normal. No rashes or lesions Lymph nodes: Cervical, supraclavicular, and axillary nodes normal. Neurologic: Alert and oriented X 3, normal strength and tone. Normal symmetric reflexes. Normal coordination and gait    Assessment:    Healthy female exam.     Plan:     See After Visit Summary for Counseling Recommendations   Keep up a regular exercise program and make sure you are eating a healthy diet Try to eat 4 servings of dairy a day, or if you are lactose intolerant take a calcium with vitamin D daily.  Your vaccines are up to date.

## 2014-09-12 ENCOUNTER — Telehealth: Payer: Self-pay | Admitting: *Deleted

## 2014-09-12 LAB — HIV ANTIBODY (ROUTINE TESTING W REFLEX): HIV: NONREACTIVE

## 2014-09-12 NOTE — Telephone Encounter (Signed)
Forms copied, scanned and faxed, confirmation received. Pt informed.Marland KitchenMarland KitchenLoralee Pacas [place]

## 2014-09-13 LAB — CYTOLOGY - PAP

## 2015-10-02 ENCOUNTER — Encounter: Payer: Self-pay | Admitting: Family Medicine

## 2015-10-02 ENCOUNTER — Ambulatory Visit (INDEPENDENT_AMBULATORY_CARE_PROVIDER_SITE_OTHER): Payer: 59 | Admitting: Family Medicine

## 2015-10-02 VITALS — BP 141/65 | HR 75 | Wt 211.0 lb

## 2015-10-02 DIAGNOSIS — L819 Disorder of pigmentation, unspecified: Secondary | ICD-10-CM

## 2015-10-02 DIAGNOSIS — L814 Other melanin hyperpigmentation: Secondary | ICD-10-CM

## 2015-10-02 DIAGNOSIS — Z0000 Encounter for general adult medical examination without abnormal findings: Secondary | ICD-10-CM

## 2015-10-02 DIAGNOSIS — Z0189 Encounter for other specified special examinations: Secondary | ICD-10-CM | POA: Diagnosis not present

## 2015-10-02 NOTE — Progress Notes (Signed)
Subjective:     Tammy Rivera is a 40 y.o. female and is here for a comprehensive physical exam. The patient reports no problems. She has been struggling with her weight some. She's been trying to use an apt help retract calories and has been trying to exercise some but she has not been the most consistent with it. She noticed she's been gaining weight since she's gotten a more sedentary job.  Social History   Social History  . Marital status: Married    Spouse name: N/A  . Number of children: N/A  . Years of education: N/A   Occupational History  . Not on file.   Social History Main Topics  . Smoking status: Former Smoker    Quit date: 02/04/2004  . Smokeless tobacco: Not on file  . Alcohol use No  . Drug use: No  . Sexual activity: Yes    Birth control/ protection: None   Other Topics Concern  . Not on file   Social History Narrative  . No narrative on file   Health Maintenance  Topic Date Due  . INFLUENZA VACCINE  09/04/2015  . PAP SMEAR  09/10/2017  . TETANUS/TDAP  04/15/2020  . HIV Screening  Completed    The following portions of the patient's history were reviewed and updated as appropriate: allergies, current medications, past family history, past medical history, past social history, past surgical history and problem list.  Review of Systems A comprehensive review of systems was negative.   Objective:    BP (!) 141/65   Pulse 75   Wt 211 lb (95.7 kg)   SpO2 100%   BMI 34.06 kg/m  General appearance: alert, cooperative and appears stated age Head: Normocephalic, without obvious abnormality, atraumatic Eyes: conj clear, EOMI, PEERLA Ears: normal TM's and external ear canals both ears Nose: Nares normal. Septum midline. Mucosa normal. No drainage or sinus tenderness. Throat: lips, mucosa, and tongue normal; teeth and gums normal Neck: no adenopathy, no carotid bruit, no JVD, supple, symmetrical, trachea midline and thyroid not enlarged, symmetric, no  tenderness/mass/nodules Back: symmetric, no curvature. ROM normal. No CVA tenderness. Lungs: clear to auscultation bilaterally Breasts: normal appearance, no masses or tenderness Heart: regular rate and rhythm, S1, S2 normal, no murmur, click, rub or gallop Abdomen: soft, non-tender; bowel sounds normal; no masses,  no organomegaly Extremities: extremities normal, atraumatic, no cyanosis or edema Pulses: 2+ and symmetric Skin: Skin color, texture, turgor normal. No rashes or lesions. She does have some solar lentigo on her facial cheeks bilaterally. Lymph nodes: Cervical, supraclavicular, and axillary nodes normal. Neurologic: Alert and oriented X 3, normal strength and tone. Normal symmetric reflexes. Normal coordination and gait    Assessment:    Healthy female exam.      Plan:     See After Visit Summary for Counseling Recommendations   Keep up a regular exercise program and make sure you are eating a healthy diet Try to eat 4 servings of dairy a day, or if you are lactose intolerant take a calcium with vitamin D daily.  Your vaccines are up to date.  We discussed strategies for diet and exercise and getting back on track.  She does have some hyperpigmentation on her cheeks along with multiple freckles. Reminded her to continue to work on using her sunscreen and sun protection including wearing a hat she is outdoors a lot with her children. Plain that this is solar [name] to go and is a sign of skin damage.

## 2015-10-02 NOTE — Patient Instructions (Signed)
Keep up a regular exercise program and make sure you are eating a healthy diet Try to eat 4 servings of dairy a day, or if you are lactose intolerant take a calcium with vitamin D daily.    

## 2015-10-03 LAB — COMPLETE METABOLIC PANEL WITH GFR
ALK PHOS: 52 U/L (ref 33–115)
ALT: 8 U/L (ref 6–29)
AST: 15 U/L (ref 10–30)
Albumin: 4.4 g/dL (ref 3.6–5.1)
BUN: 12 mg/dL (ref 7–25)
CHLORIDE: 102 mmol/L (ref 98–110)
CO2: 29 mmol/L (ref 20–31)
Calcium: 8.6 mg/dL (ref 8.6–10.2)
Creat: 0.91 mg/dL (ref 0.50–1.10)
GFR, EST NON AFRICAN AMERICAN: 80 mL/min (ref >=60)
GFR, Est African American: >89 mL/min (ref >=60)
GLUCOSE: 98 mg/dL (ref 65–99)
POTASSIUM: 3.9 mmol/L (ref 3.5–5.3)
SODIUM: 139 mmol/L (ref 135–146)
Total Bilirubin: 0.2 mg/dL (ref 0.2–1.2)
Total Protein: 6.8 g/dL (ref 6.1–8.1)

## 2015-10-03 LAB — LIPID PANEL
CHOL/HDL RATIO: 2.5 ratio (ref <=5.0)
Cholesterol: 159 mg/dL (ref 125–200)
HDL: 63 mg/dL (ref >=46)
LDL CALC: 82 mg/dL (ref <130)
Triglycerides: 72 mg/dL (ref <150)
VLDL: 14 mg/dL (ref <30)

## 2015-10-03 NOTE — Progress Notes (Signed)
All labs are normal. 

## 2016-09-29 ENCOUNTER — Telehealth: Payer: Self-pay | Admitting: Family Medicine

## 2016-09-29 NOTE — Telephone Encounter (Signed)
That is perfectly fine.  No problems.

## 2016-09-29 NOTE — Telephone Encounter (Signed)
Patient called adv that her and her husband Tammy Rivera both need to get physicals on or before 10/03/16 she totally forgot that it was due at end of month for insurance. I adv pt that you did not have any opening this week for physicals and she asked if this one time she and her husband could see someone else I adv will send a phone message and call her back to adv. Thanks

## 2016-10-03 ENCOUNTER — Ambulatory Visit (INDEPENDENT_AMBULATORY_CARE_PROVIDER_SITE_OTHER): Payer: 59 | Admitting: Physician Assistant

## 2016-10-03 ENCOUNTER — Encounter: Payer: Self-pay | Admitting: Physician Assistant

## 2016-10-03 VITALS — BP 129/80 | HR 85 | Resp 14 | Ht 65.75 in | Wt 203.5 lb

## 2016-10-03 DIAGNOSIS — Z130 Encounter for screening for diseases of the blood and blood-forming organs and certain disorders involving the immune mechanism: Secondary | ICD-10-CM | POA: Diagnosis not present

## 2016-10-03 DIAGNOSIS — Z6833 Body mass index (BMI) 33.0-33.9, adult: Secondary | ICD-10-CM | POA: Diagnosis not present

## 2016-10-03 DIAGNOSIS — N926 Irregular menstruation, unspecified: Secondary | ICD-10-CM | POA: Insufficient documentation

## 2016-10-03 DIAGNOSIS — E6609 Other obesity due to excess calories: Secondary | ICD-10-CM

## 2016-10-03 DIAGNOSIS — Z1329 Encounter for screening for other suspected endocrine disorder: Secondary | ICD-10-CM | POA: Diagnosis not present

## 2016-10-03 DIAGNOSIS — Z131 Encounter for screening for diabetes mellitus: Secondary | ICD-10-CM | POA: Diagnosis not present

## 2016-10-03 DIAGNOSIS — Z13220 Encounter for screening for lipoid disorders: Secondary | ICD-10-CM | POA: Diagnosis not present

## 2016-10-03 DIAGNOSIS — Z0000 Encounter for general adult medical examination without abnormal findings: Secondary | ICD-10-CM

## 2016-10-03 DIAGNOSIS — G43829 Menstrual migraine, not intractable, without status migrainosus: Secondary | ICD-10-CM | POA: Insufficient documentation

## 2016-10-03 LAB — CBC
HCT: 40.3 % (ref 35.0–45.0)
HEMOGLOBIN: 13.2 g/dL (ref 11.7–15.5)
MCH: 31.1 pg (ref 27.0–33.0)
MCHC: 32.8 g/dL (ref 32.0–36.0)
MCV: 95 fL (ref 80.0–100.0)
MPV: 10.7 fL (ref 7.5–12.5)
Platelets: 257 10*3/uL (ref 140–400)
RBC: 4.24 MIL/uL (ref 3.80–5.10)
RDW: 12.7 % (ref 11.0–15.0)
WBC: 4.6 10*3/uL (ref 3.8–10.8)

## 2016-10-03 NOTE — Patient Instructions (Signed)
Preventive Care 18-39 Years, Female Preventive care refers to lifestyle choices and visits with your health care provider that can promote health and wellness. What does preventive care include?  A yearly physical exam. This is also called an annual well check.  Dental exams once or twice a year.  Routine eye exams. Ask your health care provider how often you should have your eyes checked.  Personal lifestyle choices, including: ? Daily care of your teeth and gums. ? Regular physical activity. ? Eating a healthy diet. ? Avoiding tobacco and drug use. ? Limiting alcohol use. ? Practicing safe sex. ? Taking vitamin and mineral supplements as recommended by your health care provider. What happens during an annual well check? The services and screenings done by your health care provider during your annual well check will depend on your age, overall health, lifestyle risk factors, and family history of disease. Counseling Your health care provider may ask you questions about your:  Alcohol use.  Tobacco use.  Drug use.  Emotional well-being.  Home and relationship well-being.  Sexual activity.  Eating habits.  Work and work Statistician.  Method of birth control.  Menstrual cycle.  Pregnancy history.  Screening You may have the following tests or measurements:  Height, weight, and BMI.  Diabetes screening. This is done by checking your blood sugar (glucose) after you have not eaten for a while (fasting).  Blood pressure.  Lipid and cholesterol levels. These may be checked every 5 years starting at age 66.  Skin check.  Hepatitis C blood test.  Hepatitis B blood test.  Sexually transmitted disease (STD) testing.  BRCA-related cancer screening. This may be done if you have a family history of breast, ovarian, tubal, or peritoneal cancers.  Pelvic exam and Pap test. This may be done every 3 years starting at age 40. Starting at age 59, this may be done every 5  years if you have a Pap test in combination with an HPV test.  Discuss your test results, treatment options, and if necessary, the need for more tests with your health care provider. Vaccines Your health care provider may recommend certain vaccines, such as:  Influenza vaccine. This is recommended every year.  Tetanus, diphtheria, and acellular pertussis (Tdap, Td) vaccine. You may need a Td booster every 10 years.  Varicella vaccine. You may need this if you have not been vaccinated.  HPV vaccine. If you are 69 or younger, you may need three doses over 6 months.  Measles, mumps, and rubella (MMR) vaccine. You may need at least one dose of MMR. You may also need a second dose.  Pneumococcal 13-valent conjugate (PCV13) vaccine. You may need this if you have certain conditions and were not previously vaccinated.  Pneumococcal polysaccharide (PPSV23) vaccine. You may need one or two doses if you smoke cigarettes or if you have certain conditions.  Meningococcal vaccine. One dose is recommended if you are age 27-21 years and a first-year college student living in a residence hall, or if you have one of several medical conditions. You may also need additional booster doses.  Hepatitis A vaccine. You may need this if you have certain conditions or if you travel or work in places where you may be exposed to hepatitis A.  Hepatitis B vaccine. You may need this if you have certain conditions or if you travel or work in places where you may be exposed to hepatitis B.  Haemophilus influenzae type b (Hib) vaccine. You may need this if  you have certain risk factors.  Talk to your health care provider about which screenings and vaccines you need and how often you need them. This information is not intended to replace advice given to you by your health care provider. Make sure you discuss any questions you have with your health care provider. Document Released: 03/18/2001 Document Revised: 10/10/2015  Document Reviewed: 11/21/2014 Elsevier Interactive Patient Education  2017 [employer].

## 2016-10-03 NOTE — Progress Notes (Signed)
HPI:                                                                Tammy Rivera is a 41 y.o. female who presents to [facility]: [facility] today for annual physical exam  No current concerns today. Patient is requesting biometrics and physical exam for employer.  GYN / Sexual Health Obstetrics: [id number] LMP: 09/26/16 Menses: irregular, 3 weeks apart Last Pap smear: 09/11/2014, NILM, HPV negative Hx of abnormal Paps: no Contraception: none   Past Medical History:  Diagnosis Date  . Irregular menses   . Obesity    Past Surgical History:  Procedure Laterality Date  . APPENDECTOMY    . CHOLECYSTECTOMY  2007  . CRYOTHERAPY  2002  . KNEE SURGERY  1997   right knee  . LAPAROSCOPIC APPENDECTOMY  07/18/2011   Procedure: APPENDECTOMY LAPAROSCOPIC;  Surgeon: Fabio Bering, MD;  Location: [facility];  Service: General;  Laterality: N/A;   Social History  Substance Use Topics  . Smoking status: Former Smoker    Quit date: 02/04/2004  . Smokeless tobacco: Never Used  . Alcohol use No   family history includes Diabetes in her father; Heart attack in her maternal grandfather and paternal grandfather; Hypertension in her father and mother; Thyroid disease in her maternal grandmother.  ROS: negative except as noted in the HPI  Medications: No current outpatient prescriptions on file.   No current facility-administered medications for this visit.    No Known Allergies     Objective:  BP 129/80   Pulse 85   Resp 14   Ht 5' 5.75" (1.67 m)   Wt 203 lb 8 oz (92.3 kg)   LMP 09/26/2016   SpO2 99%   BMI 33.10 kg/m  General Appearance:  Alert, cooperative, not ill-appearing, no distress, appropriate for age, obese female                            Head:  Normocephalic, without obvious abnormality                             Eyes:  PERRL, EOM's intact, conjunctiva and cornea clear, visual acuity 20/25 bilaterally                             Ears:   TM pearly gray color and semitransparent, external ear canals normal, both ears                            Nose:  Nares symmetrical                          Throat:  Lips, tongue, and mucosa are moist, pink, and intact; good dentition                             Neck:  Supple; symmetrical, trachea midline, no adenopathy; thyroid: no enlargement, symmetric, no tenderness/mass/nodules  Back:  Symmetrical, no curvature, ROM normal               Chest/Breast:  deferred                           Lungs:  Clear to auscultation bilaterally, respirations unlabored                             Heart:  regular rate & normal rhythm, S1 and S2 normal, no murmurs, rubs, or gallops                     Abdomen:  Obese, soft, non-tender, no mass or organomegaly              Genitourinary:  deferred         Musculoskeletal:  Tone and strength strong and symmetrical, all extremities; no joint pain or edema, normal gait and station                     Lymphatic:  No adenopathy             Skin/Hair/Nails:  Skin warm, dry and intact, no rashes or abnormal dyspigmentation on limited exam; scattered lentigines and brown flat macules are noted                    Neurologic:  Alert and oriented x3, no cranial nerve deficits, DTR's intact, sensation grossly intact, normal gait and station, no tremor Psych: well-groomed, cooperative, good eye contact, euthymic mood, affect mood-congruent, speech is articulate, and thought processes clear and goal-directed   No results found for this or any previous visit (from the past 72 hour(s)). No results found.  Depression screen PHQ 2/9 10/03/2016  Decreased Interest 0  Down, Depressed, Hopeless 0  PHQ - 2 Score 0     Assessment and Plan: 41 y.o. female with   1. Encounter for annual physical exam - patient has biometric form from employer to be completed - reviewed PMH, PSH, PFH - reviewed Health Maintenace - Pap UTD - declined influenza -  negative PHQ2  2. Menstrual irregularity -  Reports menses are spaced closer together, approximately every 21 days. Denies menorrhagia or spotting - TSH - T4, free - T3, free  3. Menstrual migraine without status migrainosus, not intractable - respond to Ibuprofen and rest  4. Screening for diabetes mellitus - Comprehensive metabolic panel  5. Encounter for screening for lipid disorder - Lipid Panel w/reflex Direct LDL  6. Screening for blood disease - CBC  7. Screening for thyroid disorder - TSH - T4, free - T3, free  8. Obesity (BMI 33) - counseled on heart healthy lifestyle and regular cardiovascular exercise - follow-up with PCP for medical weight management as needed   Patient education and anticipatory guidance given Patient agrees with treatment plan Follow-up in 1 year for CPE w/fasting labs or sooner as needed   Levonne Hubertharley E. Cummings PA-C

## 2016-10-04 LAB — COMPREHENSIVE METABOLIC PANEL
ALBUMIN: 4.6 g/dL (ref 3.6–5.1)
ALK PHOS: 61 U/L (ref 33–115)
ALT: 8 U/L (ref 6–29)
AST: 14 U/L (ref 10–30)
BUN: 9 mg/dL (ref 7–25)
CHLORIDE: 104 mmol/L (ref 98–110)
CO2: 20 mmol/L (ref 20–32)
CREATININE: 0.69 mg/dL (ref 0.50–1.10)
Calcium: 9.2 mg/dL (ref 8.6–10.2)
Glucose, Bld: 101 mg/dL — ABNORMAL HIGH (ref 65–99)
POTASSIUM: 4.1 mmol/L (ref 3.5–5.3)
SODIUM: 141 mmol/L (ref 135–146)
Total Bilirubin: 0.4 mg/dL (ref 0.2–1.2)
Total Protein: 7 g/dL (ref 6.1–8.1)

## 2016-10-04 LAB — LIPID PANEL W/REFLEX DIRECT LDL
Cholesterol: 169 mg/dL (ref <200)
HDL: 67 mg/dL (ref >50)
LDL-Cholesterol: 87 mg/dL
NON-HDL CHOLESTEROL (CALC): 102 mg/dL (ref <130)
TRIGLYCERIDES: 67 mg/dL (ref <150)
Total CHOL/HDL Ratio: 2.5 Ratio (ref <5.0)

## 2016-10-04 LAB — TSH: TSH: 1.9 mIU/L

## 2016-10-04 LAB — T4, FREE: FREE T4: 1.2 ng/dL (ref 0.8–1.8)

## 2016-10-04 LAB — T3, FREE: T3 FREE: 3.2 pg/mL (ref 2.3–4.2)

## 2016-10-06 NOTE — Progress Notes (Signed)
Your labs look good - normal kidney function - cholesterol in a healthy range - normal blood counts - no evidence of diabetes - no evidence of thyroid disease  Biometric form will be available end of day Tuesday for pick-up

## 2016-10-10 ENCOUNTER — Telehealth: Payer: Self-pay

## 2016-10-10 NOTE — Telephone Encounter (Signed)
Pt called back results given

## 2017-09-29 ENCOUNTER — Ambulatory Visit (INDEPENDENT_AMBULATORY_CARE_PROVIDER_SITE_OTHER): Payer: 59 | Admitting: Family Medicine

## 2017-09-29 ENCOUNTER — Encounter: Payer: Self-pay | Admitting: Family Medicine

## 2017-09-29 VITALS — BP 131/70 | HR 83 | Ht 66.0 in | Wt 208.0 lb

## 2017-09-29 DIAGNOSIS — Z0000 Encounter for general adult medical examination without abnormal findings: Secondary | ICD-10-CM | POA: Diagnosis not present

## 2017-09-29 NOTE — Patient Instructions (Signed)

## 2017-09-29 NOTE — Progress Notes (Signed)
Subjective:     Tammy Rivera is a 42 y.o. female and is here for a comprehensive physical exam. The patient reports no problems. Form to be completed for work.  She has been walking some for exercise.   Social History   Socioeconomic History  . Marital status: Married    Spouse name: Not on file  . Number of children: Not on file  . Years of education: Not on file  . Highest education level: Not on file  Occupational History  . Not on file  Social Needs  . Financial resource strain: Not on file  . Food insecurity:    Worry: Not on file    Inability: Not on file  . Transportation needs:    Medical: Not on file    Non-medical: Not on file  Tobacco Use  . Smoking status: Former Smoker    Last attempt to quit: 02/04/2004    Years since quitting: 13.6  . Smokeless tobacco: Never Used  Substance and Sexual Activity  . Alcohol use: No  . Drug use: No  . Sexual activity: Yes    Birth control/protection: None  Lifestyle  . Physical activity:    Days per week: Not on file    Minutes per session: Not on file  . Stress: Not on file  Relationships  . Social connections:    Talks on phone: Not on file    Gets together: Not on file    Attends religious service: Not on file    Active member of club or organization: Not on file    Attends meetings of clubs or organizations: Not on file    Relationship status: Not on file  . Intimate partner violence:    Fear of current or ex partner: Not on file    Emotionally abused: Not on file    Physically abused: Not on file    Forced sexual activity: Not on file  Other Topics Concern  . Not on file  Social History Narrative  . Not on file   Health Maintenance  Topic Date Due  . INFLUENZA VACCINE  12/03/2017 (Originally 09/03/2017)  . PAP SMEAR  09/11/2019  . TETANUS/TDAP  04/15/2020  . HIV Screening  Completed    The following portions of the patient's history were reviewed and updated as appropriate: allergies, current medications,  past family history, past medical history, past social history, past surgical history and problem list.  Review of Systems A comprehensive review of systems was negative.   Objective:    BP 131/70   Pulse 83   Ht 5\' 6"  (1.676 m)   Wt 208 lb (94.3 kg)   SpO2 100%   BMI 33.57 kg/m  General appearance: alert, cooperative and appears stated age Head: Normocephalic, without obvious abnormality, atraumatic Eyes: conj clear, EOMI, PEERLA Ears: normal TM's and external ear canals both ears Nose: Nares normal. Septum midline. Mucosa normal. No drainage or sinus tenderness. Throat: lips, mucosa, and tongue normal; teeth and gums normal Neck: no adenopathy, no carotid bruit, no JVD, supple, symmetrical, trachea midline and thyroid not enlarged, symmetric, no tenderness/mass/nodules Back: symmetric, no curvature. ROM normal. No CVA tenderness. Lungs: clear to auscultation bilaterally Breasts: normal appearance, no masses or tenderness Heart: regular rate and rhythm, S1, S2 normal, no murmur, click, rub or gallop Abdomen: soft, non-tender; bowel sounds normal; no masses,  no organomegaly Extremities: extremities normal, atraumatic, no cyanosis or edema Pulses: 2+ and symmetric Skin: Skin color, texture, turgor normal. No rashes  or lesions Lymph nodes: Cervical, supraclavicular, and axillary nodes normal. Neurologic: Alert and oriented X 3, normal strength and tone. Normal symmetric reflexes. Normal coordination and gait    Assessment:    Healthy female exam.      Plan:     See After Visit Summary for Counseling Recommendations   Keep up a regular exercise program and make sure you are eating a healthy diet Try to eat 4 servings of dairy a day, or if you are lactose intolerant take a calcium with vitamin D daily.  Your vaccines are up to date.

## 2017-09-30 LAB — COMPLETE METABOLIC PANEL WITH GFR
AG RATIO: 1.7 (calc) (ref 1.0–2.5)
ALKALINE PHOSPHATASE (APISO): 51 U/L (ref 33–115)
ALT: 8 U/L (ref 6–29)
AST: 15 U/L (ref 10–30)
Albumin: 4.7 g/dL (ref 3.6–5.1)
BILIRUBIN TOTAL: 0.3 mg/dL (ref 0.2–1.2)
BUN: 10 mg/dL (ref 7–25)
CHLORIDE: 105 mmol/L (ref 98–110)
CO2: 24 mmol/L (ref 20–32)
Calcium: 9.3 mg/dL (ref 8.6–10.2)
Creat: 0.72 mg/dL (ref 0.50–1.10)
GFR, EST AFRICAN AMERICAN: 121 mL/min/{1.73_m2} (ref >=60)
GFR, Est Non African American: 105 mL/min/{1.73_m2} (ref >=60)
GLUCOSE: 106 mg/dL — AB (ref 65–99)
Globulin: 2.7 g/dL (calc) (ref 1.9–3.7)
POTASSIUM: 4 mmol/L (ref 3.5–5.3)
Sodium: 138 mmol/L (ref 135–146)
TOTAL PROTEIN: 7.4 g/dL (ref 6.1–8.1)

## 2017-09-30 LAB — LIPID PANEL
CHOLESTEROL: 158 mg/dL (ref <200)
HDL: 66 mg/dL (ref >50)
LDL Cholesterol (Calc): 79 mg/dL (calc)
Non-HDL Cholesterol (Calc): 92 mg/dL (calc) (ref <130)
Total CHOL/HDL Ratio: 2.4 (calc) (ref <5.0)
Triglycerides: 53 mg/dL (ref <150)

## 2017-09-30 LAB — HEMOGLOBIN A1C
EAG (MMOL/L): 5.8 (calc)
HEMOGLOBIN A1C: 5.3 %{Hb} (ref <5.7)
Mean Plasma Glucose: 105 (calc)

## 2017-10-02 ENCOUNTER — Telehealth: Payer: Self-pay | Admitting: *Deleted

## 2017-10-02 NOTE — Telephone Encounter (Signed)
Form completed, faxed, confirmation received, and scanned into chart.Heath GoldBarkley, Janith Nielson Lynetta, CMA Pt notified when called w/results .Marland Kitchen.Heath GoldBarkley, Keldon Lassen Lynetta, CMA

## 2018-06-24 DIAGNOSIS — L723 Sebaceous cyst: Secondary | ICD-10-CM | POA: Diagnosis not present

## 2019-09-05 ENCOUNTER — Ambulatory Visit (INDEPENDENT_AMBULATORY_CARE_PROVIDER_SITE_OTHER): Payer: 59 | Admitting: Family Medicine

## 2019-09-05 ENCOUNTER — Other Ambulatory Visit: Payer: Self-pay

## 2019-09-05 ENCOUNTER — Other Ambulatory Visit (HOSPITAL_COMMUNITY)
Admission: RE | Admit: 2019-09-05 | Discharge: 2019-09-05 | Disposition: A | Discharge status: Home / Self Care | Payer: 59 | Source: Ambulatory Visit | Attending: Family Medicine | Admitting: Family Medicine

## 2019-09-05 ENCOUNTER — Encounter: Payer: Self-pay | Admitting: Family Medicine

## 2019-09-05 VITALS — BP 151/87 | HR 107 | Temp 98.0°F | Ht 66.0 in | Wt 218.0 lb

## 2019-09-05 DIAGNOSIS — Z124 Encounter for screening for malignant neoplasm of cervix: Secondary | ICD-10-CM | POA: Insufficient documentation

## 2019-09-05 DIAGNOSIS — Z8616 Personal history of COVID-19: Secondary | ICD-10-CM | POA: Diagnosis not present

## 2019-09-05 DIAGNOSIS — N926 Irregular menstruation, unspecified: Secondary | ICD-10-CM | POA: Diagnosis not present

## 2019-09-05 DIAGNOSIS — Z0000 Encounter for general adult medical examination without abnormal findings: Secondary | ICD-10-CM | POA: Diagnosis not present

## 2019-09-05 NOTE — Patient Instructions (Signed)
Health Maintenance, Female Adopting a healthy lifestyle and getting preventive care are important in promoting health and wellness. Ask your health care provider about:  The right schedule for you to have regular tests and exams.  Things you can do on your own to prevent diseases and keep yourself healthy. What should I know about diet, weight, and exercise? Eat a healthy diet   Eat a diet that includes plenty of vegetables, fruits, low-fat dairy products, and lean protein.  Do not eat a lot of foods that are high in solid fats, added sugars, or sodium. Maintain a healthy weight Body mass index (BMI) is used to identify weight problems. It estimates body fat based on height and weight. Your health care provider can help determine your BMI and help you achieve or maintain a healthy weight. Get regular exercise Get regular exercise. This is one of the most important things you can do for your health. Most adults should:  Exercise for at least 150 minutes each week. The exercise should increase your heart rate and make you sweat (moderate-intensity exercise).  Do strengthening exercises at least twice a week. This is in addition to the moderate-intensity exercise.  Spend less time sitting. Even light physical activity can be beneficial. Watch cholesterol and blood lipids Have your blood tested for lipids and cholesterol at 42 years of age, then have this test every 5 years. Have your cholesterol levels checked more often if:  Your lipid or cholesterol levels are high.  You are older than 42 years of age.  You are at high risk for heart disease. What should I know about cancer screening? Depending on your health history and family history, you may need to have cancer screening at various ages. This may include screening for:  Breast cancer.  Cervical cancer.  Colorectal cancer.  Skin cancer.  Lung cancer. What should I know about heart disease, diabetes, and high blood  pressure? Blood pressure and heart disease  High blood pressure causes heart disease and increases the risk of stroke. This is more likely to develop in people who have high blood pressure readings, are of African descent, or are overweight.  Have your blood pressure checked: ? Every 3-5 years if you are 18-39 years of age. ? Every year if you are 40 years old or older. Diabetes Have regular diabetes screenings. This checks your fasting blood sugar level. Have the screening done:  Once every three years after age 40 if you are at a normal weight and have a low risk for diabetes.  More often and at a younger age if you are overweight or have a high risk for diabetes. What should I know about preventing infection? Hepatitis B If you have a higher risk for hepatitis B, you should be screened for this virus. Talk with your health care provider to find out if you are at risk for hepatitis B infection. Hepatitis C Testing is recommended for:  Everyone born from 1945 through 1965.  Anyone with known risk factors for hepatitis C. Sexually transmitted infections (STIs)  Get screened for STIs, including gonorrhea and chlamydia, if: ? You are sexually active and are younger than 42 years of age. ? You are older than 42 years of age and your health care provider tells you that you are at risk for this type of infection. ? Your sexual activity has changed since you were last screened, and you are at increased risk for chlamydia or gonorrhea. Ask your health care provider if   you are at risk.  Ask your health care provider about whether you are at high risk for HIV. Your health care provider may recommend a prescription medicine to help prevent HIV infection. If you choose to take medicine to prevent HIV, you should first get tested for HIV. You should then be tested every 3 months for as long as you are taking the medicine. Pregnancy  If you are about to stop having your period (premenopausal) and  you may become pregnant, seek counseling before you get pregnant.  Take 400 to 800 micrograms (mcg) of folic acid every day if you become pregnant.  Ask for birth control (contraception) if you want to prevent pregnancy. Osteoporosis and menopause Osteoporosis is a disease in which the bones lose minerals and strength with aging. This can result in bone fractures. If you are 65 years old or older, or if you are at risk for osteoporosis and fractures, ask your health care provider if you should:  Be screened for bone loss.  Take a calcium or vitamin D supplement to lower your risk of fractures.  Be given hormone replacement therapy (HRT) to treat symptoms of menopause. Follow these instructions at home: Lifestyle  Do not use any products that contain nicotine or tobacco, such as cigarettes, e-cigarettes, and chewing tobacco. If you need help quitting, ask your health care provider.  Do not use street drugs.  Do not share needles.  Ask your health care provider for help if you need support or information about quitting drugs. Alcohol use  Do not drink alcohol if: ? Your health care provider tells you not to drink. ? You are pregnant, may be pregnant, or are planning to become pregnant.  If you drink alcohol: ? Limit how much you use to 0-1 drink a day. ? Limit intake if you are breastfeeding.  Be aware of how much alcohol is in your drink. In the [place] one drink equals one 12 oz bottle of beer (355 mL), one 5 oz glass of wine (148 mL), or one 1 oz glass of hard liquor (44 mL). General instructions  Schedule regular health, dental, and eye exams.  Stay current with your vaccines.  Tell your health care provider if: ? You often feel depressed. ? You have ever been abused or do not feel safe at home. Summary  Adopting a healthy lifestyle and getting preventive care are important in promoting health and wellness.  Follow your health care provider's instructions about healthy  diet, exercising, and getting tested or screened for diseases.  Follow your health care provider's instructions on monitoring your cholesterol and blood pressure. This information is not intended to replace advice given to you by your health care provider. Make sure you discuss any questions you have with your health care provider. Document Revised: 01/13/2018 Document Reviewed: 01/13/2018 Elsevier Patient Education  2020 Elsevier Inc.  

## 2019-09-05 NOTE — Progress Notes (Signed)
Subjective:     Tammy Rivera is a 44 y.o. female and is here for a comprehensive physical exam. The patient reports no problems.  She is doing well overall.  She did have Covid earlier this year and still has had some residual symptoms since then such as brain fog.  She has not had a screening mammogram yet.  Last Tdap was in 2012.  Social History   Socioeconomic History  . Marital status: Married    Spouse name: Not on file  . Number of children: Not on file  . Years of education: Not on file  . Highest education level: Not on file  Occupational History  . Not on file  Tobacco Use  . Smoking status: Former Smoker    Quit date: 02/04/2004    Years since quitting: 15.5  . Smokeless tobacco: Never Used  Substance and Sexual Activity  . Alcohol use: No  . Drug use: No  . Sexual activity: Yes    Birth control/protection: None  Other Topics Concern  . Not on file  Social History Narrative  . Not on file   Social Determinants of Health   Financial Resource Strain:   . Difficulty of Paying Living Expenses:   Food Insecurity:   . Worried About Programme researcher, broadcasting/film/video in the Last Year:   . Barista in the Last Year:   Transportation Needs:   . Freight forwarder (Medical):   Marland Kitchen Lack of Transportation (Non-Medical):   Physical Activity:   . Days of Exercise per Week:   . Minutes of Exercise per Session:   Stress:   . Feeling of Stress :   Social Connections:   . Frequency of Communication with Friends and Family:   . Frequency of Social Gatherings with Friends and Family:   . Attends Religious Services:   . Active Member of Clubs or Organizations:   . Attends Banker Meetings:   Marland Kitchen Marital Status:   Intimate Partner Violence:   . Fear of Current or Ex-Partner:   . Emotionally Abused:   Marland Kitchen Physically Abused:   . Sexually Abused:    Health Maintenance  Topic Date Due  . Hepatitis C Screening  Never done  . INFLUENZA VACCINE  09/04/2019  . PAP  SMEAR-Modifier  09/11/2019  . COVID-19 Vaccine (1) 09/21/2019 (Originally 09/26/1989)  . TETANUS/TDAP  04/15/2020  . HIV Screening  Completed    The following portions of the patient's history were reviewed and updated as appropriate: allergies, current medications, past family history, past medical history, past social history, past surgical history and problem list.  Review of Systems A comprehensive review of systems was negative.   Objective:    BP (!) 151/87 (BP Location: Right Arm, Patient Position: Sitting)   Pulse (!) 107   Temp 98 F (36.7 C)   Ht 5\' 6"  (1.676 m)   Wt 218 lb (98.9 kg)   SpO2 98%   BMI 35.19 kg/m  General appearance: alert, cooperative and appears stated age Head: Normocephalic, without obvious abnormality, atraumatic Eyes: conj claer, EOMi, PEEERLA Ears: normal TM's and external ear canals both ears Nose: Nares normal. Septum midline. Mucosa normal. No drainage or sinus tenderness. Throat: lips, mucosa, and tongue normal; teeth and gums normal Neck: no adenopathy, no carotid bruit, no JVD, supple, symmetrical, trachea midline and thyroid not enlarged, symmetric, no tenderness/mass/nodules Back: symmetric, no curvature. ROM normal. No CVA tenderness. Lungs: clear to auscultation bilaterally Breasts: no inspected  Heart: regular rate and rhythm, S1, S2 normal, no murmur, click, rub or gallop Abdomen: soft, non-tender; bowel sounds normal; no masses,  no organomegaly Pelvic: cervix normal in appearance, external genitalia normal, no adnexal masses or tenderness, no cervical motion tenderness, rectovaginal septum normal, uterus normal size, shape, and consistency and vagina normal without discharge Extremities: extremities normal, atraumatic, no cyanosis or edema Pulses: 2+ and symmetric Skin: Skin color, texture, turgor normal. No rashes or lesions Lymph nodes: Cervical, supraclavicular, and axillary nodes normal. Neurologic: Alert and oriented X 3, normal  strength and tone. Normal symmetric reflexes. Normal coordination and gait    Assessment:    Healthy female exam.      Plan:     See After Visit Summary for Counseling Recommendations   Keep up a regular exercise program and make sure you are eating a healthy diet Try to eat 4 servings of dairy a day, or if you are lactose intolerant take a calcium with vitamin D daily.  Your vaccines are up to date.  Discussed screening mammogram options.  She will think about it and let me know. Tdap is up-to-date.  She did get her flu vaccine last October through work. Due for labs today.

## 2019-09-06 LAB — COMPLETE METABOLIC PANEL WITH GFR
AG Ratio: 1.7 (calc) (ref 1.0–2.5)
ALT: 8 U/L (ref 6–29)
AST: 15 U/L (ref 10–30)
Albumin: 4.5 g/dL (ref 3.6–5.1)
Alkaline phosphatase (APISO): 56 U/L (ref 31–125)
BUN: 14 mg/dL (ref 7–25)
CO2: 26 mmol/L (ref 20–32)
Calcium: 9 mg/dL (ref 8.6–10.2)
Chloride: 103 mmol/L (ref 98–110)
Creat: 0.78 mg/dL (ref 0.50–1.10)
GFR, Est African American: 109 mL/min/{1.73_m2} (ref >=60)
GFR, Est Non African American: 94 mL/min/{1.73_m2} (ref >=60)
Globulin: 2.6 g/dL (calc) (ref 1.9–3.7)
Glucose, Bld: 91 mg/dL (ref 65–139)
Potassium: 3.9 mmol/L (ref 3.5–5.3)
Sodium: 137 mmol/L (ref 135–146)
Total Bilirubin: 0.3 mg/dL (ref 0.2–1.2)
Total Protein: 7.1 g/dL (ref 6.1–8.1)

## 2019-09-06 LAB — CBC
HCT: 39.8 % (ref 35.0–45.0)
Hemoglobin: 13.1 g/dL (ref 11.7–15.5)
MCH: 31 pg (ref 27.0–33.0)
MCHC: 32.9 g/dL (ref 32.0–36.0)
MCV: 94.1 fL (ref 80.0–100.0)
MPV: 11.4 fL (ref 7.5–12.5)
Platelets: 247 10*3/uL (ref 140–400)
RBC: 4.23 10*6/uL (ref 3.80–5.10)
RDW: 12 % (ref 11.0–15.0)
WBC: 5.8 10*3/uL (ref 3.8–10.8)

## 2019-09-06 LAB — LIPID PANEL
Cholesterol: 146 mg/dL (ref <200)
HDL: 58 mg/dL (ref >=50)
LDL Cholesterol (Calc): 72 mg/dL (calc)
Non-HDL Cholesterol (Calc): 88 mg/dL (calc) (ref <130)
Total CHOL/HDL Ratio: 2.5 (calc) (ref <5.0)
Triglycerides: 77 mg/dL (ref <150)

## 2019-09-11 LAB — CYTOLOGY - PAP
Adequacy: ABSENT
Comment: NEGATIVE
Diagnosis: NEGATIVE
High risk HPV: NEGATIVE

## 2019-10-05 ENCOUNTER — Encounter: Payer: Self-pay | Admitting: Family Medicine

## 2019-10-05 NOTE — Telephone Encounter (Signed)
Can you the pt know please.

## 2019-10-05 NOTE — Telephone Encounter (Signed)
Done

## 2020-02-13 ENCOUNTER — Encounter: Payer: Self-pay | Admitting: Family Medicine

## 2020-02-13 ENCOUNTER — Telehealth (INDEPENDENT_AMBULATORY_CARE_PROVIDER_SITE_OTHER): Payer: BC Managed Care – PPO | Admitting: Family Medicine

## 2020-02-13 DIAGNOSIS — K529 Noninfective gastroenteritis and colitis, unspecified: Secondary | ICD-10-CM | POA: Diagnosis not present

## 2020-02-13 DIAGNOSIS — Z9049 Acquired absence of other specified parts of digestive tract: Secondary | ICD-10-CM | POA: Diagnosis not present

## 2020-02-13 DIAGNOSIS — F410 Panic disorder [episodic paroxysmal anxiety] without agoraphobia: Secondary | ICD-10-CM

## 2020-02-13 MED ORDER — SERTRALINE HCL 50 MG PO TABS
ORAL_TABLET | ORAL | 1 refills | Status: DC
Start: 2020-02-13 — End: 2020-03-15

## 2020-02-13 NOTE — Progress Notes (Signed)
Virtual Visit via Video Note  I connected with Tammy Rivera on 02/13/20 at 10:10 AM EST by a video enabled telemedicine application and verified that I am speaking with the correct person using two identifiers.   I discussed the limitations of evaluation and management by telemedicine and the availability of in person appointments. The patient expressed understanding and agreed to proceed.  Patient location: at home  Provider location: in office  Subjective:    CC: bleching and anxiety  HPI: On Friday(4 days ago) was at work and was leaned over her desk when she tried to stand up felt a sharp pain in her throat that radiated into her sternum. It was brief but says it really ramped her anxiety and then had a panic attack and then started burping uncontrollably.  Went home and finally laid down and then kept having some indigestion. Had a salad for lunch that she had made. She felt dizzy alter that evening but also felt her anxiety was worse. 150/93 that night. Had some blurry vision over the weekend.  Diarrhea couple of times.  No abdominal pain.  No nausea.  He says she always tries to work through her panic attacks by doing deep breathing, walking.  She says it always seems to be worse close to her menstrual cycle.  Supposed to start her period any day.  He just feels like she is on edge sometimes with her anxiety she said she was on medication at 1 point after the birth of one of her sons.  But that has been a long time she said it did work well she just does not remember what it was.  BP trending in the 140-150s. Pressure in her sinuses and some HA.  Eating under 1500 mg of sodium per day.    Past medical history, Surgical history, Family history not pertinant except as noted below, Social history, Allergies, and medications have been entered into the medical record, reviewed, and corrections made.   Review of Systems: No fevers, chills, night sweats, weight loss, chest pain, or shortness  of breath.   Objective:    General: Speaking clearly in complete sentences without any shortness of breath.  Alert and oriented x3.  Normal judgment. No apparent acute distress.    Impression and Recommendations:    PANIC ATTACK This episode really triggered her panic attacks.  Discussed options.  We discussed medication. She tries to work through it which is great.  It sounds like she is really develop some good coping skills over her lifetime.  But we discussed trying to get ahead of this before it gets worse.  She said at one point her anxiety had ramped up so much that she did not want to leave her home and she does not want to get to that point again.  We discussed possibly starting an SSRI.  We will start with sertraline follow-up in 3 to 4 weeks to see how she is doing.  We can make any adjustments needed at that time or even switch medications if needed.  Also think she would probably benefit from some behavioral cognitive therapy.  We can discuss further at the next office visit..    Gastroenteritis -aced on her description of events with the sudden onset of reflux belching gas and then a couple episodes of diarrhea I suspect this was gastroenteritis related unclear if it something that she may have eaten something that was bacterial or more viral she never actually vomited and her symptoms  are improved.  I think this was self-limited and she is already feeling better unfortunately the symptoms really ramped up her anxiety which is always been on the edge but she had not actually had panic attacks in years.    Time spent in encounter 30 minutes  I discussed the assessment and treatment plan with the patient. The patient was provided an opportunity to ask questions and all were answered. The patient agreed with the plan and demonstrated an understanding of the instructions.   The patient was advised to call back or seek an in-person evaluation if the symptoms worsen or if the condition  fails to improve as anticipated.   Nani Gasser, MD

## 2020-02-13 NOTE — Progress Notes (Signed)
Called pt 2x and the phone rang with no answer both times.

## 2020-02-13 NOTE — Assessment & Plan Note (Addendum)
This episode really triggered her panic attacks.  Discussed options.  We discussed medication. She tries to work through it which is great.  It sounds like she is really develop some good coping skills over her lifetime.  But we discussed trying to get ahead of this before it gets worse.  She said at one point her anxiety had ramped up so much that she did not want to leave her home and she does not want to get to that point again.  We discussed possibly starting an SSRI.  We will start with sertraline follow-up in 3 to 4 weeks to see how she is doing.  We can make any adjustments needed at that time or even switch medications if needed.  Also think she would probably benefit from some behavioral cognitive therapy.  We can discuss further at the next office visit.Marland Kitchen

## 2020-03-15 ENCOUNTER — Other Ambulatory Visit: Payer: Self-pay

## 2020-03-15 ENCOUNTER — Encounter: Payer: Self-pay | Admitting: Family Medicine

## 2020-03-15 MED ORDER — SERTRALINE HCL 25 MG PO TABS
25.0000 mg | ORAL_TABLET | Freq: Every day | ORAL | 0 refills | Status: DC
Start: 2020-03-15 — End: 2020-06-29

## 2020-03-15 MED ORDER — SERTRALINE HCL 25 MG PO TABS
25.0000 mg | ORAL_TABLET | Freq: Every day | ORAL | 0 refills | Status: DC
Start: 2020-03-15 — End: 2020-03-15

## 2020-03-15 NOTE — Telephone Encounter (Signed)
Pt informed that new persciption was sent to her pharmacy. She informed me that she has switched to [facility] in [place].

## 2020-03-15 NOTE — Telephone Encounter (Signed)
Asked that this be cancelled pt wanted to change pharmacies.

## 2020-03-15 NOTE — Telephone Encounter (Signed)
Sent 25mg .  Sounds like she is doing awesome!!!

## 2020-06-29 ENCOUNTER — Other Ambulatory Visit: Payer: Self-pay | Admitting: *Deleted

## 2020-06-29 MED ORDER — SERTRALINE HCL 25 MG PO TABS: 25.0000 mg | ORAL_TABLET | Freq: Every day | ORAL | 0 refills | Status: DC

## 2020-10-03 ENCOUNTER — Encounter: Payer: Self-pay | Admitting: Family Medicine

## 2020-10-15 ENCOUNTER — Other Ambulatory Visit: Payer: Self-pay | Admitting: Family Medicine

## 2020-10-15 MED ORDER — SERTRALINE HCL 25 MG PO TABS: 25.0000 mg | ORAL_TABLET | Freq: Every day | ORAL | 0 refills | Status: DC

## 2020-10-17 ENCOUNTER — Encounter: Payer: Self-pay | Admitting: Family Medicine

## 2021-01-17 ENCOUNTER — Encounter: Payer: Self-pay | Admitting: Family Medicine

## 2021-01-17 ENCOUNTER — Other Ambulatory Visit: Payer: Self-pay | Admitting: Family Medicine

## 2021-02-12 ENCOUNTER — Encounter: Payer: Self-pay | Admitting: Family Medicine

## 2021-02-12 ENCOUNTER — Telehealth (INDEPENDENT_AMBULATORY_CARE_PROVIDER_SITE_OTHER): Payer: 59 | Admitting: Family Medicine

## 2021-02-12 VITALS — Resp 18 | Ht 66.0 in | Wt 220.0 lb

## 2021-02-12 DIAGNOSIS — U071 COVID-19: Secondary | ICD-10-CM

## 2021-02-12 MED ORDER — PREDNISONE 20 MG PO TABS: 40.0000 mg | ORAL_TABLET | Freq: Every day | ORAL | 0 refills | Status: DC

## 2021-02-12 NOTE — Progress Notes (Signed)
° ° °  Virtual Visit via Video Note  I connected with Tammy Rivera on 02/12/21 at 11:30 AM EST by a video enabled telemedicine application and verified that I am speaking with the correct person using two identifiers.   I discussed the limitations of evaluation and management by telemedicine and the availability of in person appointments. The patient expressed understanding and agreed to proceed.  Patient location: at home Provider location: in office  Subjective:    CC:   Chief Complaint  Patient presents with   Cough    Symptoms started Friday Nasal congestion, facial pressure/pain, body aches, runny nose. Loss of taste and Smell. Negative covid test on 02/09/21.     HPI: Sick, started on Friday with nasal congestion, facial pressure/pain, body aches, runny nose. Loss of taste and smell.  Neg COVID test on Monday but tested again today and it was positive.  Says having severe sinus pain. Bodyaches are better today.  + fever off and on. Woke up sweating last night.  Using Advil some here and there for fever.     Past medical history, Surgical history, Family history not pertinant except as noted below, Social history, Allergies, and medications have been entered into the medical record, reviewed, and corrections made.    Objective:    General: Speaking clearly in complete sentences without any shortness of breath.  Alert and oriented x3.  Normal judgment. No apparent acute distress.    Impression and Recommendations:    Problem List Items Addressed This Visit   None Visit Diagnoses     COVID-19    -  Primary      COVID-19 - discussed treatment options.  Since she has has severe sinus pressure and facial pressure and [place] go ahead and treat with prednisone.  If not getting some relief in the next couple days then please let me know also if she still running a fever by Thursday please let me know or if not significantly better after the weekend then please give me a call  back. Discussed quarantine guidelines.     No orders of the defined types were placed in this encounter.   Meds ordered this encounter  Medications   predniSONE (DELTASONE) 20 MG tablet    Sig: Take 2 tablets (40 mg total) by mouth daily with breakfast.    Dispense:  10 tablet    Refill:  0     I discussed the assessment and treatment plan with the patient. The patient was provided an opportunity to ask questions and all were answered. The patient agreed with the plan and demonstrated an understanding of the instructions.   The patient was advised to call back or seek an in-person evaluation if the symptoms worsen or if the condition fails to improve as anticipated.   Nani Gasser, MD

## 2021-03-05 ENCOUNTER — Encounter: Payer: Self-pay | Admitting: Family Medicine

## 2021-04-16 ENCOUNTER — Encounter: Payer: Self-pay | Admitting: Family Medicine

## 2021-04-17 MED ORDER — PROGESTERONE 200 MG PO CAPS: 200.0000 mg | ORAL_CAPSULE | Freq: Every day | ORAL | 5 refills | Status: DC

## 2021-04-17 NOTE — Telephone Encounter (Signed)
Meds ordered this encounter  ?Medications  ? progesterone (PROMETRIUM) 200 MG capsule  ?  Sig: Take 1 capsule (200 mg total) by mouth at bedtime.  ?  Dispense:  20 capsule  ?  Refill:  5  ? ? ?

## 2021-05-29 ENCOUNTER — Encounter: Payer: Self-pay | Admitting: Adult Health

## 2021-05-29 ENCOUNTER — Ambulatory Visit (INDEPENDENT_AMBULATORY_CARE_PROVIDER_SITE_OTHER): Payer: 59 | Admitting: Adult Health

## 2021-05-29 VITALS — BP 141/81 | HR 85 | Ht 65.5 in | Wt 236.5 lb

## 2021-05-29 DIAGNOSIS — N920 Excessive and frequent menstruation with regular cycle: Secondary | ICD-10-CM

## 2021-05-29 DIAGNOSIS — N946 Dysmenorrhea, unspecified: Secondary | ICD-10-CM

## 2021-05-29 DIAGNOSIS — R102 Pelvic and perineal pain: Secondary | ICD-10-CM | POA: Diagnosis not present

## 2021-05-29 NOTE — Progress Notes (Signed)
Patient ID: Tammy Rivera, female   DOB: 07-16-1975, 46 y.o.   MRN: [id number] ?History of Present Illness: ?Tammy Rivera is a 46 year old white female, married, [id number], she is new pt,in complaining of painful periods, with clots and RLQ pain and nausea at times. Has started Prometrium from PCP and it may have helped a little, ibuprofen does not help much. She says has always had bad periods but worse in last year.  ?She had normal pap with negative HPV 09/05/19. ?PCP is Dr Linford Arnold.  ? ?Current Medications, Allergies, Past Medical History, Past Surgical History, Family History and Social History were reviewed in [employer] record.   ? ? ?Review of Systems: ?Has painful periods, with clots and nausea ?Has pain RLQ at times ?Periods lasting about 5-6 days and changes pads every 4 hours, but will wear 2 sometimes ?Denies any pain with sex ?Has pain sometimes when not on period ?Reviewed past medical,surgical, social and family history. Reviewed medications and allergies.  ? ? ? ?Physical Exam:BP (!) 141/81 (BP Location: Left Arm, Patient Position: Sitting, Cuff Size: Large)   Pulse 85   Ht 5' 5.5" (1.664 m)   Wt 236 lb 8 oz (107.3 kg)   LMP 05/16/2021   BMI 38.76 kg/m?   ?General:  Well developed, well nourished, no acute distress ?Skin:  Warm and dry ?Lungs; Clear to auscultation bilaterally ?Cardiovascular: Regular rate and rhythm ?Pelvic:  External genitalia is normal in appearance, no lesions.  The vagina is normal in appearance. Urethra has no lesions or masses. The cervix is bulbous.  Uterus is felt to be normal size, shape, and contour.  No adnexal masses or tenderness noted.Bladder is non tender, no masses felt. ?Did have some cramping after exam RLQ ?Extremities/musculoskeletal:  No swelling or varicosities noted, no clubbing or cyanosis ?Psych:  No mood changes, alert and cooperative,seems happy ?AA is 1 ?Fall risk is low ? ?  05/29/2021  ?  9:42 AM 02/12/2021  ? 10:30 AM 09/05/2019  ?  2:28  PM  ?Depression screen PHQ 2/9  ?Decreased Interest 0 0 0  ?Down, Depressed, Hopeless 0 0 0  ?PHQ - 2 Score 0 0 0  ?Altered sleeping 0    ?Tired, decreased energy 1    ?Change in appetite 1    ?Feeling bad or failure about yourself  0    ?Trouble concentrating 0    ?Moving slowly or fidgety/restless 0    ?Suicidal thoughts 0    ?PHQ-9 Score 2    ?  ? ?  05/29/2021  ?  9:42 AM  ?GAD 7 : Generalized Anxiety Score  ?Nervous, Anxious, on Edge 0  ?Control/stop worrying 0  ?Worry too much - different things 0  ?Trouble relaxing 0  ?Restless 0  ?Easily annoyed or irritable 0  ?Afraid - awful might happen 0  ?Total GAD 7 Score 0  ? ? Upstream - 05/29/21 0952   ? ?  ? Pregnancy Intention Screening  ? Does the patient want to become pregnant in the next year? No   ? Does the patient's partner want to become pregnant in the next year? No   ? Would the patient like to discuss contraceptive options today? No   ?  ? Contraception Wrap Up  ? Current Method Withdrawal or Other Method   ? End Method Withdrawal or Other Method   ? ?  ?  ? ?  ?  ? Co exam with Tammy Lax NP student  ? ?  Impression and Plan: ?1. Pelvic pain ?Will get pelvic US ? ?2. Dysmenorrhea ?Will get pelvic US 06/05/21 at [facility] at 2:30 pm to assess uterus and ovaries ?Will follow up in office in 2 weeks to go over [place] results and treatment options ? ?3. Menorrhagia with regular cycle ?Will get pelvic US  ? ? ? ?  ?  ?

## 2021-06-05 ENCOUNTER — Ambulatory Visit (HOSPITAL_COMMUNITY): Payer: 59

## 2021-06-12 ENCOUNTER — Ambulatory Visit: Payer: 59 | Admitting: Adult Health

## 2021-07-04 ENCOUNTER — Ambulatory Visit (INDEPENDENT_AMBULATORY_CARE_PROVIDER_SITE_OTHER): Payer: 59 | Admitting: Adult Health

## 2021-07-04 ENCOUNTER — Encounter: Payer: Self-pay | Admitting: Adult Health

## 2021-07-04 ENCOUNTER — Ambulatory Visit (INDEPENDENT_AMBULATORY_CARE_PROVIDER_SITE_OTHER): Payer: 59

## 2021-07-04 VITALS — BP 136/86 | HR 77 | Ht 65.5 in | Wt 235.4 lb

## 2021-07-04 DIAGNOSIS — R102 Pelvic and perineal pain: Secondary | ICD-10-CM | POA: Diagnosis not present

## 2021-07-04 DIAGNOSIS — N946 Dysmenorrhea, unspecified: Secondary | ICD-10-CM | POA: Diagnosis not present

## 2021-07-04 DIAGNOSIS — N920 Excessive and frequent menstruation with regular cycle: Secondary | ICD-10-CM

## 2021-07-04 MED ORDER — NORETHINDRONE ACETATE 5 MG PO TABS: 5.0000 mg | ORAL_TABLET | Freq: Every day | ORAL | 3 refills | Status: DC

## 2021-07-04 NOTE — Progress Notes (Signed)
  Subjective:     Patient ID: Tammy Rivera, female   DOB: [birth year], 46 y.o.   MRN: [id number]  HPI Tammy Rivera is a 46 year old white female, married, [id number], in for pelvic US today. Has painful periods with clots and RLQ pelvic pain. She is on 200 mg Prometrium and not helping.  PCP is Dr Linford Arnold.   Review of Systems Has painful periods, with clots and nausea Has pain RLQ at times Periods lasting about 5-6 days and changes pads every 4 hours, but will wear 2 sometimes Denies any pain with sex   Reviewed past medical,surgical, social and family history. Reviewed medications and allergies.  Objective:   Physical Exam BP 136/86 (BP Location: Right Arm, Patient Position: Sitting, Cuff Size: Normal)   Pulse 77   Ht 5' 5.5" (1.664 m)   Wt 235 lb 6.4 oz (106.8 kg)   LMP 06/11/2021   BMI 38.58 kg/m     Skin warm and dry. Lungs: clear to ausculation bilaterally. Cardiovascular: regular rate and rhythm.  Reviewed [place]: showed normal ovaries, uterus enlarged, EEC is 9.9 mm.  Upstream - 07/04/21 1405       Pregnancy Intention Screening   Does the patient want to become pregnant in the next year? No    Does the patient's partner want to become pregnant in the next year? No    Would the patient like to discuss contraceptive options today? No      Contraception Wrap Up   Current Method Withdrawal or Other Method    End Method Withdrawal or Other Method    Contraception Counseling Provided No             Assessment:     1. Dysmenorrhea Discussed COCs, IUD, and ablation, she is not interested in COCs, or IUD Given handout on ablation   2. Menorrhagia with regular cycle Stop Prometrium, and start aygestin 5 mg 1 daily Meds ordered this encounter  Medications   norethindrone (AYGESTIN) 5 MG tablet    Sig: Take 1 tablet (5 mg total) by mouth daily.    Dispense:  30 tablet    Refill:  3    Order Specific Question:   Supervising Provider    Answer:   Despina Hidden, LUTHER H [[id number]]     3.  Pelvic pain     Plan:     She will call me in 3=4 weeks in follow up and let me know if she wants ablation or not

## 2021-07-04 NOTE — Progress Notes (Signed)
PELVIC US TA/TV: enlarged homogeneous anteverted uterus,WNL,EEC 9.9 mm,normal ovaries,ovaries appear mobile,no free fluid,no pain during ultrasound  Chaperone Peggy

## 2021-09-17 ENCOUNTER — Encounter: Payer: Self-pay | Admitting: Family Medicine

## 2021-09-17 MED ORDER — SERTRALINE HCL 25 MG PO TABS: 25.0000 mg | ORAL_TABLET | Freq: Every day | ORAL | 1 refills | Status: DC

## 2022-01-17 ENCOUNTER — Telehealth: Payer: 59 | Admitting: Emergency Medicine

## 2022-01-17 ENCOUNTER — Encounter: Payer: Self-pay | Admitting: Family Medicine

## 2022-01-17 DIAGNOSIS — J069 Acute upper respiratory infection, unspecified: Secondary | ICD-10-CM | POA: Diagnosis not present

## 2022-01-17 MED ORDER — FLUTICASONE PROPIONATE 50 MCG/ACT NA SUSP: 2.0000 | Freq: Every day | NASAL | 0 refills | Status: DC

## 2022-01-17 MED ORDER — BENZONATATE 100 MG PO CAPS: 100.0000 mg | ORAL_CAPSULE | Freq: Two times a day (BID) | ORAL | 0 refills | Status: DC | PRN

## 2022-01-17 NOTE — Telephone Encounter (Signed)
Yes needs virtual visit, we also have e visit if no appt available.

## 2022-01-17 NOTE — Progress Notes (Signed)
E-Visit for Upper Respiratory Infection   We are sorry you are not feeling well.  Here is how we plan to help!  Based on what you have shared with me, it looks like you may have a viral upper respiratory infection.  Upper respiratory infections are caused by a large number of viruses; however, rhinovirus is the most common cause. Your symptoms don't sound like they could be influenza.   Symptoms vary from person to person, with common symptoms including sore throat, cough, fatigue or lack of energy and feeling of general discomfort.  A low-grade fever of up to 100.4 may present, but is often uncommon.  Symptoms vary however, and are closely related to a person's age or underlying illnesses.  The most common symptoms associated with an upper respiratory infection are nasal discharge or congestion, cough, sneezing, headache and pressure in the ears and face.  These symptoms usually persist for about 3 to 10 days, but can last up to 2 weeks.  It is important to know that upper respiratory infections do not cause serious illness or complications in most cases.    Upper respiratory infections can be transmitted from person to person, with the most common method of transmission being a person's hands.  The virus is able to live on the skin and can infect other persons for up to 2 hours after direct contact.  Also, these can be transmitted when someone coughs or sneezes; thus, it is important to cover the mouth to reduce this risk.  To keep the spread of the illness at bay, good hand hygiene is very important.  This is an infection that is most likely caused by a virus. There are no specific treatments other than to help you with the symptoms until the infection runs its course.  We are sorry you are not feeling well.  Here is how we plan to help!   For nasal congestion, you may use an oral decongestants such as Mucinex D or if you have glaucoma or high blood pressure use plain Mucinex.    Saline nasal spray  or nasal drops can help and can safely be used as often as needed for congestion.  Try using saline irrigation, such as with a neti pot, several times a day while you are sick. Many neti pots come with salt packets premeasured to use to make saline. If you use your own salt, make sure it is kosher salt or sea salt (don't use table salt as it has iodine in it and you don't need that in your nose). Use distilled water to make saline. If you mix your own saline using your own salt, the recipe is 1/4 teaspoon salt in 1 cup warm water. Using saline irrigation can help prevent and treat sinus infections.   For your congestion, I have prescribed Fluticasone nasal spray one spray in each nostril twice a day  If you do not have a history of heart disease, hypertension, diabetes or thyroid disease, prostate/bladder issues or glaucoma, you may also use Sudafed to treat nasal congestion.  It is highly recommended that you consult with a pharmacist or your primary care physician to ensure this medication is safe for you to take.     If you have a cough, you may use cough suppressants such as Delsym and Robitussin.  If you have glaucoma or high blood pressure, you can also use Coricidin HBP.   For cough I have prescribed for you A prescription cough medication called Tessalon Perles  100 mg. You may take 1-2 capsules every 8 hours as needed for cough  If you have a sore or scratchy throat, use a saltwater gargle-  to  teaspoon of salt dissolved in a 4-ounce to 8-ounce glass of warm water.  Gargle the solution for approximately 15-30 seconds and then spit.  It is important not to swallow the solution.  You can also use throat lozenges/cough drops and Chloraseptic spray to help with throat pain or discomfort.  Warm or cold liquids can also be helpful in relieving throat pain.  For headache, pain or general discomfort, you can use Ibuprofen or Tylenol as directed.   Some authorities believe that zinc sprays or the use  of Echinacea may shorten the course of your symptoms.   HOME CARE Only take medications as instructed by your medical team. Be sure to drink plenty of fluids. Water is fine as well as fruit juices, sodas and electrolyte beverages. You may want to stay away from caffeine or alcohol. If you are nauseated, try taking small sips of liquids. How do you know if you are getting enough fluid? Your urine should be a pale yellow or almost colorless. Get rest. Taking a steamy shower or using a humidifier may help nasal congestion and ease sore throat pain. You can place a towel over your head and breathe in the steam from hot water coming from a faucet. Using a saline nasal spray works much the same way. Cough drops, hard candies and sore throat lozenges may ease your cough. Avoid close contacts especially the very young and the elderly Cover your mouth if you cough or sneeze Always remember to wash your hands.   GET HELP RIGHT AWAY IF: You develop worsening fever. If your symptoms do not improve within 10 days You develop yellow or green discharge from your nose over 3 days. You have coughing fits You develop a severe head ache or visual changes. You develop shortness of breath, difficulty breathing or start having chest pain Your symptoms persist after you have completed your treatment plan  MAKE SURE YOU  Understand these instructions. Will watch your condition. Will get help right away if you are not doing well or get worse.  Thank you for choosing an e-visit.  Your e-visit answers were reviewed by a board certified advanced clinical practitioner to complete your personal care plan. Depending upon the condition, your plan could have included both over the counter or prescription medications.  Please review your pharmacy choice. Make sure the pharmacy is open so you can pick up prescription now. If there is a problem, you may contact your provider through [employer] and have the  prescription routed to another pharmacy.  Your safety is important to [place]. If you have drug allergies check your prescription carefully.   For the next 24 hours you can use MyChart to ask questions about today's visit, request a non-urgent call back, or ask for a work or school excuse. You will get an email in the next two days asking about your experience. I hope that your e-visit has been valuable and will speed your recovery.  I have spent 5 minutes in review of e-visit questionnaire, review and updating patient chart, medical decision making and response to patient.   Rica Mast, PhD, FNP-BC

## 2022-03-03 ENCOUNTER — Encounter: Payer: Self-pay | Admitting: Family Medicine

## 2022-03-14 ENCOUNTER — Encounter: Payer: Self-pay | Admitting: Family Medicine

## 2022-03-14 MED ORDER — OSELTAMIVIR PHOSPHATE 75 MG PO CAPS: 75.0000 mg | ORAL_CAPSULE | Freq: Every day | ORAL | 0 refills | Status: DC

## 2022-03-24 NOTE — Telephone Encounter (Signed)
Would see if we can get him in tomorrow

## 2022-07-14 ENCOUNTER — Encounter: Payer: Self-pay | Admitting: Family Medicine

## 2022-07-14 MED ORDER — SERTRALINE HCL 25 MG PO TABS: 25.0000 mg | ORAL_TABLET | Freq: Every day | ORAL | 1 refills | Status: DC

## 2023-02-02 ENCOUNTER — Encounter (INDEPENDENT_AMBULATORY_CARE_PROVIDER_SITE_OTHER): Payer: Self-pay | Admitting: Family Medicine

## 2023-02-02 DIAGNOSIS — Z6379 Other stressful life events affecting family and household: Secondary | ICD-10-CM

## 2023-02-03 NOTE — Telephone Encounter (Signed)
Routing to T as I haven't addressed his shoulder issue before. th

## 2023-02-06 NOTE — Telephone Encounter (Signed)

## 2023-06-29 ENCOUNTER — Encounter: Payer: Self-pay | Admitting: Family Medicine

## 2023-06-30 ENCOUNTER — Ambulatory Visit (INDEPENDENT_AMBULATORY_CARE_PROVIDER_SITE_OTHER): Admitting: Family Medicine

## 2023-06-30 ENCOUNTER — Encounter: Payer: Self-pay | Admitting: Family Medicine

## 2023-06-30 VITALS — BP 140/68 | HR 116 | Ht 65.5 in | Wt 243.5 lb

## 2023-06-30 DIAGNOSIS — N6313 Unspecified lump in the right breast, lower outer quadrant: Secondary | ICD-10-CM | POA: Diagnosis not present

## 2023-06-30 DIAGNOSIS — F410 Panic disorder [episodic paroxysmal anxiety] without agoraphobia: Secondary | ICD-10-CM | POA: Diagnosis not present

## 2023-06-30 MED ORDER — DOXYCYCLINE HYCLATE 100 MG PO TABS: 100.0000 mg | ORAL_TABLET | Freq: Two times a day (BID) | ORAL | 0 refills | Status: AC

## 2023-06-30 NOTE — Telephone Encounter (Signed)
 Patient scheduled for OV today with Dr. Greer Leak.

## 2023-06-30 NOTE — Progress Notes (Signed)
   Acute Office Visit  Subjective:     Patient ID: Tammy Rivera, female    DOB: 08/09/1975, 48 y.o.   MRN: [id number]  Chief Complaint  Patient presents with   Breast Pain    R Breast lump she stated that she noticed this on Sunday. The area is red and tender and the lump feels oval like. She stated that there is not a FHx of BrCa.     HPI Patient is in today for   R Breast lump she stated that she noticed this on Sunday. The area is red and tender and the lump feels oval-like. She stated that there is not a FHx of BrCa  Has never had a mammogram. No injury or trauma. No change in bra wear.    She also came off the Effexor recently.  She said that she had gained some much weight she even went to [facility] for 3 months and was on 1000-calorie a day diet and could not lose weight since coming off of the medication she is actually already down almost 20 pounds.  ROS      Objective:    BP (!) 140/68   Pulse (!) 116   Ht 5' 5.5" (1.664 m)   Wt 243 lb 8 oz (110.5 kg)   SpO2 99%   BMI 39.90 kg/m    Physical Exam Chest:       No results found for any visits on 06/30/23. Oval shaped area of firmness with erythema over the surface.  Approx 1 cm from the edge of the areola.  Approx 2 x 2 cm in size.       Assessment & Plan:   Problem List Items Addressed This Visit       Other   PANIC ATTACK   Regards to mood we could certainly look at other options if the sertraline  has caused significant amount of weight gain she might be a great candidate for something like Fetzima or Viibryd.      Other Visit Diagnoses       Mass of lower outer quadrant of right breast    -  Primary   Relevant Medications   doxycycline (VIBRA-TABS) 100 MG tablet   Other Relevant Orders   MM 3D DIAGNOSTIC MAMMOGRAM BILATERAL BREAST   US  LIMITED ULTRASOUND INCLUDING AXILLA RIGHT BREAST      ON exam concerned about infected cyst  - will tx with doxy while awaiting further imaging. Consider  inflammed cyst vs early mastitis.    Meds ordered this encounter  Medications   doxycycline (VIBRA-TABS) 100 MG tablet    Sig: Take 1 tablet (100 mg total) by mouth 2 (two) times daily.    Dispense:  14 tablet    Refill:  0    No follow-ups on file.  Duaine German, MD

## 2023-06-30 NOTE — Telephone Encounter (Signed)
 FYI - change of location for the mammo referral. Thanks in advance.

## 2023-06-30 NOTE — Assessment & Plan Note (Signed)
 Regards to mood we could certainly look at other options if the sertraline  has caused significant amount of weight gain she might be a great candidate for something like Fetzima or Viibryd.

## 2023-07-28 ENCOUNTER — Other Ambulatory Visit: Payer: Self-pay | Admitting: Family Medicine

## 2023-07-28 ENCOUNTER — Ambulatory Visit

## 2023-07-28 ENCOUNTER — Ambulatory Visit
Admission: RE | Admit: 2023-07-28 | Discharge: 2023-07-28 | Disposition: A | Discharge status: Home / Self Care | Source: Ambulatory Visit | Attending: Family Medicine | Admitting: Family Medicine

## 2023-07-28 DIAGNOSIS — N6313 Unspecified lump in the right breast, lower outer quadrant: Secondary | ICD-10-CM

## 2023-07-28 DIAGNOSIS — N6320 Unspecified lump in the left breast, unspecified quadrant: Secondary | ICD-10-CM
